# Patient Record
Sex: Male | Born: 1970 | Race: White | Hispanic: No | Marital: Single | Smoking: Current every day smoker
Health system: Southern US, Community
[De-identification: ages and names within clinical notes are randomized; demographics above are authoritative.]

## PROBLEM LIST (undated history)

## (undated) DIAGNOSIS — S3992XA Unspecified injury of lower back, initial encounter: Secondary | ICD-10-CM

## (undated) DIAGNOSIS — M549 Dorsalgia, unspecified: Secondary | ICD-10-CM

## (undated) DIAGNOSIS — C6290 Malignant neoplasm of unspecified testis, unspecified whether descended or undescended: Secondary | ICD-10-CM

## (undated) DIAGNOSIS — G8929 Other chronic pain: Secondary | ICD-10-CM

## (undated) DIAGNOSIS — M1990 Unspecified osteoarthritis, unspecified site: Secondary | ICD-10-CM

## (undated) HISTORY — PX: TESTICLE REMOVAL: SHX68

---

## 2003-10-16 ENCOUNTER — Emergency Department (HOSPITAL_COMMUNITY): Admission: EM | Admit: 2003-10-16 | Discharge: 2003-10-16 | Payer: Self-pay | Admitting: Emergency Medicine

## 2003-12-25 ENCOUNTER — Emergency Department (HOSPITAL_COMMUNITY): Admission: EM | Admit: 2003-12-25 | Discharge: 2003-12-25 | Payer: Self-pay | Admitting: Emergency Medicine

## 2004-06-09 ENCOUNTER — Emergency Department (HOSPITAL_COMMUNITY): Admission: EM | Admit: 2004-06-09 | Discharge: 2004-06-09 | Payer: Self-pay | Admitting: *Deleted

## 2004-07-23 ENCOUNTER — Ambulatory Visit: Payer: Self-pay | Admitting: Psychiatry

## 2004-07-23 ENCOUNTER — Emergency Department (HOSPITAL_COMMUNITY): Admission: EM | Admit: 2004-07-23 | Discharge: 2004-07-23 | Payer: Self-pay | Admitting: Emergency Medicine

## 2004-07-23 ENCOUNTER — Inpatient Hospital Stay (HOSPITAL_COMMUNITY): Admission: RE | Admit: 2004-07-23 | Discharge: 2004-07-27 | Payer: Self-pay | Admitting: Psychiatry

## 2004-12-08 ENCOUNTER — Emergency Department (HOSPITAL_COMMUNITY): Admission: EM | Admit: 2004-12-08 | Discharge: 2004-12-08 | Payer: Self-pay | Admitting: *Deleted

## 2005-07-18 ENCOUNTER — Emergency Department (HOSPITAL_COMMUNITY): Admission: EM | Admit: 2005-07-18 | Discharge: 2005-07-19 | Payer: Self-pay | Admitting: *Deleted

## 2005-07-24 ENCOUNTER — Emergency Department (HOSPITAL_COMMUNITY): Admission: EM | Admit: 2005-07-24 | Discharge: 2005-07-24 | Payer: Self-pay | Admitting: Emergency Medicine

## 2006-05-22 ENCOUNTER — Emergency Department (HOSPITAL_COMMUNITY): Admission: EM | Admit: 2006-05-22 | Discharge: 2006-05-23 | Payer: Self-pay | Admitting: Emergency Medicine

## 2006-06-24 ENCOUNTER — Emergency Department (HOSPITAL_COMMUNITY): Admission: EM | Admit: 2006-06-24 | Discharge: 2006-06-24 | Payer: Self-pay | Admitting: Emergency Medicine

## 2007-02-26 ENCOUNTER — Emergency Department: Payer: Self-pay | Admitting: Emergency Medicine

## 2007-03-30 ENCOUNTER — Emergency Department (HOSPITAL_COMMUNITY): Admission: EM | Admit: 2007-03-30 | Discharge: 2007-03-31 | Payer: Self-pay | Admitting: Emergency Medicine

## 2007-06-03 ENCOUNTER — Emergency Department (HOSPITAL_COMMUNITY): Admission: EM | Admit: 2007-06-03 | Discharge: 2007-06-03 | Payer: Self-pay | Admitting: Emergency Medicine

## 2007-07-20 ENCOUNTER — Emergency Department: Payer: Self-pay | Admitting: Emergency Medicine

## 2007-11-14 ENCOUNTER — Emergency Department: Payer: Self-pay | Admitting: Emergency Medicine

## 2007-11-28 ENCOUNTER — Ambulatory Visit: Payer: Self-pay | Admitting: Urology

## 2007-12-09 ENCOUNTER — Ambulatory Visit: Payer: Self-pay | Admitting: Radiation Oncology

## 2007-12-27 ENCOUNTER — Ambulatory Visit: Payer: Self-pay | Admitting: Urology

## 2007-12-29 ENCOUNTER — Ambulatory Visit: Payer: Self-pay | Admitting: Radiation Oncology

## 2008-01-09 ENCOUNTER — Ambulatory Visit: Payer: Self-pay | Admitting: Radiation Oncology

## 2008-02-08 ENCOUNTER — Ambulatory Visit: Payer: Self-pay | Admitting: Radiation Oncology

## 2008-03-10 ENCOUNTER — Ambulatory Visit: Payer: Self-pay | Admitting: Radiation Oncology

## 2008-07-21 ENCOUNTER — Emergency Department (HOSPITAL_BASED_OUTPATIENT_CLINIC_OR_DEPARTMENT_OTHER): Admission: EM | Admit: 2008-07-21 | Discharge: 2008-07-21 | Payer: Self-pay | Admitting: Emergency Medicine

## 2009-01-21 ENCOUNTER — Ambulatory Visit: Payer: Self-pay | Admitting: Family Medicine

## 2009-01-21 ENCOUNTER — Encounter: Payer: Self-pay | Admitting: Family Medicine

## 2009-01-21 DIAGNOSIS — R5383 Other fatigue: Secondary | ICD-10-CM

## 2009-01-21 DIAGNOSIS — R5381 Other malaise: Secondary | ICD-10-CM

## 2009-01-21 DIAGNOSIS — R42 Dizziness and giddiness: Secondary | ICD-10-CM

## 2009-01-21 DIAGNOSIS — R0789 Other chest pain: Secondary | ICD-10-CM | POA: Insufficient documentation

## 2009-01-21 DIAGNOSIS — C6290 Malignant neoplasm of unspecified testis, unspecified whether descended or undescended: Secondary | ICD-10-CM

## 2009-01-29 ENCOUNTER — Ambulatory Visit: Payer: Self-pay | Admitting: Oncology

## 2009-04-07 ENCOUNTER — Emergency Department (HOSPITAL_COMMUNITY): Admission: EM | Admit: 2009-04-07 | Discharge: 2009-04-07 | Payer: Self-pay | Admitting: Emergency Medicine

## 2009-04-09 ENCOUNTER — Ambulatory Visit: Payer: Self-pay | Admitting: Family Medicine

## 2009-04-23 ENCOUNTER — Telehealth: Payer: Self-pay | Admitting: Family Medicine

## 2009-04-24 ENCOUNTER — Ambulatory Visit: Payer: Self-pay | Admitting: Family Medicine

## 2009-06-02 ENCOUNTER — Emergency Department (HOSPITAL_COMMUNITY): Admission: EM | Admit: 2009-06-02 | Discharge: 2009-06-02 | Payer: Self-pay | Admitting: Emergency Medicine

## 2009-12-22 ENCOUNTER — Emergency Department (HOSPITAL_COMMUNITY): Admission: EM | Admit: 2009-12-22 | Discharge: 2009-12-22 | Payer: Self-pay | Admitting: Emergency Medicine

## 2010-03-19 ENCOUNTER — Emergency Department (HOSPITAL_COMMUNITY): Admission: EM | Admit: 2010-03-19 | Discharge: 2010-03-19 | Payer: Self-pay | Admitting: Emergency Medicine

## 2010-07-31 ENCOUNTER — Emergency Department (HOSPITAL_BASED_OUTPATIENT_CLINIC_OR_DEPARTMENT_OTHER)
Admission: EM | Admit: 2010-07-31 | Discharge: 2010-07-31 | Payer: Self-pay | Source: Home / Self Care | Admitting: Emergency Medicine

## 2010-08-06 ENCOUNTER — Emergency Department (HOSPITAL_BASED_OUTPATIENT_CLINIC_OR_DEPARTMENT_OTHER)
Admission: EM | Admit: 2010-08-06 | Discharge: 2010-08-06 | Payer: Self-pay | Source: Home / Self Care | Admitting: Emergency Medicine

## 2010-09-07 LAB — CONVERTED CEMR LAB
ALT: 20 units/L (ref 0–53)
AST: 19 units/L (ref 0–37)
Albumin: 4.3 g/dL (ref 3.5–5.2)
Alkaline Phosphatase: 70 units/L (ref 39–117)
BUN: 19 mg/dL (ref 6–23)
Basophils Absolute: 0 10*3/uL (ref 0.0–0.1)
Basophils Relative: 0.1 % (ref 0.0–3.0)
Bilirubin, Direct: 0.2 mg/dL (ref 0.0–0.3)
CO2: 27 meq/L (ref 19–32)
Calcium: 9.3 mg/dL (ref 8.4–10.5)
Chloride: 98 meq/L (ref 96–112)
Cholesterol: 180 mg/dL (ref 0–200)
Creatinine, Ser: 0.8 mg/dL (ref 0.4–1.5)
Eosinophils Absolute: 0.1 10*3/uL (ref 0.0–0.7)
Eosinophils Relative: 1.4 % (ref 0.0–5.0)
GFR calc non Af Amer: 114.44 mL/min (ref >60)
Glucose, Bld: 80 mg/dL (ref 70–99)
HCT: 47.4 % (ref 39.0–52.0)
HDL: 50.9 mg/dL (ref >39.00)
Hemoglobin: 16.8 g/dL (ref 13.0–17.0)
LDL Cholesterol: 113 mg/dL — ABNORMAL HIGH (ref 0–99)
Lymphocytes Relative: 12.1 % (ref 12.0–46.0)
Lymphs Abs: 0.7 10*3/uL (ref 0.7–4.0)
MCHC: 35.5 g/dL (ref 30.0–36.0)
MCV: 93.2 fL (ref 78.0–100.0)
Monocytes Absolute: 0.4 10*3/uL (ref 0.1–1.0)
Monocytes Relative: 6.3 % (ref 3.0–12.0)
Neutro Abs: 4.8 10*3/uL (ref 1.4–7.7)
Neutrophils Relative %: 80.1 % — ABNORMAL HIGH (ref 43.0–77.0)
Platelets: 171 10*3/uL (ref 150.0–400.0)
Potassium: 4 meq/L (ref 3.5–5.1)
RBC: 5.09 M/uL (ref 4.22–5.81)
RDW: 13.3 % (ref 11.5–14.6)
Sodium: 131 meq/L — ABNORMAL LOW (ref 135–145)
TSH: 1.82 microintl units/mL (ref 0.35–5.50)
Total Bilirubin: 0.9 mg/dL (ref 0.3–1.2)
Total CHOL/HDL Ratio: 4
Total Protein: 7.1 g/dL (ref 6.0–8.3)
Triglycerides: 82 mg/dL (ref 0.0–149.0)
VLDL: 16.4 mg/dL (ref 0.0–40.0)
WBC: 6 10*3/uL (ref 4.5–10.5)

## 2010-09-20 ENCOUNTER — Emergency Department (HOSPITAL_BASED_OUTPATIENT_CLINIC_OR_DEPARTMENT_OTHER)
Admission: EM | Admit: 2010-09-20 | Discharge: 2010-09-20 | Disposition: A | Discharge status: Home / Self Care | Payer: Worker's Compensation | Attending: Emergency Medicine | Admitting: Emergency Medicine

## 2010-09-20 ENCOUNTER — Emergency Department (INDEPENDENT_AMBULATORY_CARE_PROVIDER_SITE_OTHER): Payer: Worker's Compensation

## 2010-09-20 DIAGNOSIS — F17200 Nicotine dependence, unspecified, uncomplicated: Secondary | ICD-10-CM | POA: Insufficient documentation

## 2010-09-20 DIAGNOSIS — Y9269 Other specified industrial and construction area as the place of occurrence of the external cause: Secondary | ICD-10-CM

## 2010-09-20 DIAGNOSIS — F319 Bipolar disorder, unspecified: Secondary | ICD-10-CM | POA: Insufficient documentation

## 2010-09-20 DIAGNOSIS — S20229A Contusion of unspecified back wall of thorax, initial encounter: Secondary | ICD-10-CM | POA: Insufficient documentation

## 2010-09-20 DIAGNOSIS — W19XXXA Unspecified fall, initial encounter: Secondary | ICD-10-CM | POA: Insufficient documentation

## 2010-09-20 DIAGNOSIS — Y9289 Other specified places as the place of occurrence of the external cause: Secondary | ICD-10-CM | POA: Insufficient documentation

## 2010-09-20 DIAGNOSIS — K219 Gastro-esophageal reflux disease without esophagitis: Secondary | ICD-10-CM | POA: Insufficient documentation

## 2010-10-17 ENCOUNTER — Emergency Department (HOSPITAL_COMMUNITY)
Admission: EM | Admit: 2010-10-17 | Discharge: 2010-10-18 | Disposition: A | Discharge status: Other Acute Inpatient Hospital | Payer: BC Managed Care – PPO | Attending: Emergency Medicine | Admitting: Emergency Medicine

## 2010-10-17 DIAGNOSIS — F3289 Other specified depressive episodes: Secondary | ICD-10-CM | POA: Insufficient documentation

## 2010-10-17 DIAGNOSIS — F329 Major depressive disorder, single episode, unspecified: Secondary | ICD-10-CM | POA: Insufficient documentation

## 2010-10-17 DIAGNOSIS — F489 Nonpsychotic mental disorder, unspecified: Secondary | ICD-10-CM | POA: Insufficient documentation

## 2010-10-17 LAB — DIFFERENTIAL
Basophils Absolute: 0 10*3/uL (ref 0.0–0.1)
Basophils Relative: 0 % (ref 0–1)
Eosinophils Absolute: 0.2 10*3/uL (ref 0.0–0.7)
Eosinophils Relative: 3 % (ref 0–5)
Lymphocytes Relative: 26 % (ref 12–46)
Lymphs Abs: 1.8 10*3/uL (ref 0.7–4.0)
Monocytes Absolute: 0.5 10*3/uL (ref 0.1–1.0)
Monocytes Relative: 7 % (ref 3–12)
Neutro Abs: 4.4 10*3/uL (ref 1.7–7.7)
Neutrophils Relative %: 64 % (ref 43–77)

## 2010-10-17 LAB — COMPREHENSIVE METABOLIC PANEL
ALT: 22 U/L (ref 0–53)
AST: 20 U/L (ref 0–37)
Albumin: 4.4 g/dL (ref 3.5–5.2)
Alkaline Phosphatase: 68 U/L (ref 39–117)
BUN: 10 mg/dL (ref 6–23)
CO2: 28 mEq/L (ref 19–32)
Calcium: 9.6 mg/dL (ref 8.4–10.5)
Chloride: 104 mEq/L (ref 96–112)
Creatinine, Ser: 1.02 mg/dL (ref 0.4–1.5)
GFR calc Af Amer: >60 mL/min (ref >60)
GFR calc non Af Amer: >60 mL/min (ref >60)
Glucose, Bld: 102 mg/dL — ABNORMAL HIGH (ref 70–99)
Potassium: 3.9 mEq/L (ref 3.5–5.1)
Sodium: 138 mEq/L (ref 135–145)
Total Bilirubin: 0.4 mg/dL (ref 0.3–1.2)
Total Protein: 7.1 g/dL (ref 6.0–8.3)

## 2010-10-17 LAB — ETHANOL: Alcohol, Ethyl (B): <5 mg/dL (ref 0–10)

## 2010-10-17 LAB — RAPID URINE DRUG SCREEN, HOSP PERFORMED
Amphetamines: NOT DETECTED
Barbiturates: NOT DETECTED
Benzodiazepines: NOT DETECTED
Cocaine: NOT DETECTED
Opiates: NOT DETECTED
Tetrahydrocannabinol: NOT DETECTED

## 2010-10-17 LAB — CBC
HCT: 46.6 % (ref 39.0–52.0)
Hemoglobin: 16.1 g/dL (ref 13.0–17.0)
MCH: 32.2 pg (ref 26.0–34.0)
MCHC: 34.5 g/dL (ref 30.0–36.0)
MCV: 93.2 fL (ref 78.0–100.0)
Platelets: 234 10*3/uL (ref 150–400)
RBC: 5 MIL/uL (ref 4.22–5.81)
RDW: 12.6 % (ref 11.5–15.5)
WBC: 6.9 10*3/uL (ref 4.0–10.5)

## 2010-10-18 ENCOUNTER — Inpatient Hospital Stay (HOSPITAL_COMMUNITY)
Admission: RE | Admit: 2010-10-18 | Discharge: 2010-10-21 | DRG: 430 | Disposition: A | Discharge status: Home / Self Care | Payer: BC Managed Care – PPO | Source: Ambulatory Visit | Attending: Psychiatry | Admitting: Psychiatry

## 2010-10-18 DIAGNOSIS — R45851 Suicidal ideations: Secondary | ICD-10-CM

## 2010-10-18 DIAGNOSIS — F411 Generalized anxiety disorder: Secondary | ICD-10-CM

## 2010-10-18 DIAGNOSIS — F1220 Cannabis dependence, uncomplicated: Secondary | ICD-10-CM

## 2010-10-18 DIAGNOSIS — F319 Bipolar disorder, unspecified: Secondary | ICD-10-CM

## 2010-10-18 DIAGNOSIS — Z560 Unemployment, unspecified: Secondary | ICD-10-CM

## 2010-10-18 DIAGNOSIS — F332 Major depressive disorder, recurrent severe without psychotic features: Principal | ICD-10-CM

## 2010-10-18 DIAGNOSIS — F331 Major depressive disorder, recurrent, moderate: Secondary | ICD-10-CM

## 2010-10-19 DIAGNOSIS — F39 Unspecified mood [affective] disorder: Secondary | ICD-10-CM

## 2010-10-19 DIAGNOSIS — F1220 Cannabis dependence, uncomplicated: Secondary | ICD-10-CM

## 2010-10-20 LAB — URINALYSIS, ROUTINE W REFLEX MICROSCOPIC
Bilirubin Urine: NEGATIVE
Glucose, UA: NEGATIVE mg/dL
Hgb urine dipstick: NEGATIVE
Ketones, ur: NEGATIVE mg/dL
Nitrite: NEGATIVE
Protein, ur: NEGATIVE mg/dL
Specific Gravity, Urine: 1.045 — ABNORMAL HIGH (ref 1.005–1.030)
Urobilinogen, UA: 0.2 mg/dL (ref 0.0–1.0)
pH: 5 (ref 5.0–8.0)

## 2010-10-20 LAB — DIFFERENTIAL
Basophils Absolute: 0 10*3/uL (ref 0.0–0.1)
Basophils Relative: 0 % (ref 0–1)
Eosinophils Absolute: 0.3 10*3/uL (ref 0.0–0.7)
Eosinophils Relative: 4 % (ref 0–5)
Lymphocytes Relative: 20 % (ref 12–46)
Lymphs Abs: 1.4 10*3/uL (ref 0.7–4.0)
Monocytes Absolute: 0.7 10*3/uL (ref 0.1–1.0)
Monocytes Relative: 10 % (ref 3–12)
Neutro Abs: 4.6 10*3/uL (ref 1.7–7.7)
Neutrophils Relative %: 66 % (ref 43–77)

## 2010-10-20 LAB — BASIC METABOLIC PANEL
BUN: 18 mg/dL (ref 6–23)
CO2: 21 mEq/L (ref 19–32)
Calcium: 9.2 mg/dL (ref 8.4–10.5)
Chloride: 112 mEq/L (ref 96–112)
Creatinine, Ser: 0.9 mg/dL (ref 0.4–1.5)
GFR calc Af Amer: >60 mL/min (ref >60)
GFR calc non Af Amer: >60 mL/min (ref >60)
Glucose, Bld: 96 mg/dL (ref 70–99)
Potassium: 4 mEq/L (ref 3.5–5.1)
Sodium: 144 mEq/L (ref 135–145)

## 2010-10-20 LAB — CBC
HCT: 43.2 % (ref 39.0–52.0)
Hemoglobin: 15 g/dL (ref 13.0–17.0)
MCH: 31.5 pg (ref 26.0–34.0)
MCHC: 34.7 g/dL (ref 30.0–36.0)
MCV: 90.8 fL (ref 78.0–100.0)
Platelets: 262 10*3/uL (ref 150–400)
RBC: 4.76 MIL/uL (ref 4.22–5.81)
RDW: 13 % (ref 11.5–15.5)
WBC: 7.1 10*3/uL (ref 4.0–10.5)

## 2010-10-23 NOTE — Discharge Summary (Signed)
NAME:  Eugene Lin, Eugene Lin              ACCOUNT NO.:  [id number]  MEDICAL RECORD NO.:  [id number]           PATIENT TYPE:  I  LOCATION:  0504                          FACILITY:  [facility]  PHYSICIAN:  [name], DO        DATE OF BIRTH:  [birth year]  DATE OF ADMISSION:  10/18/2010 DATE OF DISCHARGE:  10/20/2010                              DISCHARGE SUMMARY   REASON FOR ADMISSION:  This is a 40 year old male that was admitted after patient attempted to hang himself after arguing with his mother. Girlfriend brought the patient to the emergency department.  No history of any alcohol or substance use.  He initially had some homicidal ideation towards a man that he blamed for losing his employment.  FINAL DIAGNOSES:  Axis I:  Major depressive disorder, recurrent, severe, without psychotic features; synthetic marijuana dependence; rule out bipolar disorder, not otherwise specified. Axis II:  Deferred. Axis III:  None. Axis IV:  Financial, relationship stressors, and occupation. Axis V:  Current is 50 to 55.  SIGNIFICANT LABS:  BMET is within normal limits.  Alcohol level was less than 5.  Urine drug screen was negative.  CBC is within normal limits.  SIGNIFICANT FINDINGS:  This is a fully alert and oriented male, appropriately groomed and dressed.  Speech:  Normal rate, rhythm and tone.  Mood is somewhat irritable.  Thought processes were clear, rational and goal-oriented.  The patient was admitted to the adult unit into the mood disorder group.  He had been on initiated on Risperdal to stabilize mood and help with depression, anxiety.  The patient was endorsing racing thoughts, poor sleep and mood swings.  We contacted the patient's fiancee to address safety concerns and to provide information. The patient reported, again, his symptoms of depression and mood changes, episodes of rage.  We initiated Depakote for mood stability. On day of discharge, the patient was feeling much better.  He  felt he was in a good place.  He tolerated his first dose of Depakote.  His suicidal thoughts had resolved.  He had no homicidal thoughts.  He had good news about a job interview.  He was future-directed and was going to be living with his fiancee.  He showed no signs of hypomania, mania or overt anxiety.  We felt the patient was stable for discharge.  DISCHARGE MEDICATIONS: 1. Depakote 500 mg 1 q.h.s. 2. Risperdal 0.5 mg 1 tablet b.i.d. 3. Vistaril 2 capsules q.h.s. sleep.  FURTHER INSTRUCTIONS:  The patient was to get lab work; CBC, LFTs and valproic acid level in 5 days and was to follow up with Dr. Sandria Manly for a prescription adjustment.  The patient had appointment on March 22 at 1:45; also had an appointment with [facility] on Wednesday, March 21. The patient was to go to the emergency room for any suicidal, homicidal thoughts or psychotic symptoms.     Landry Corporal, N.P.   ______________________________ [name], DO    JO/MEDQ  D:  10/22/2010  T:  10/22/2010  Job:  [id number]  Electronically Signed by Limmie PatriciaP. on 10/23/2010 09:17:25 AM Electronically Signed by  [name] MD on 10/23/2010 09:21:03 PM

## 2010-10-24 LAB — CBC
HCT: 42.7 % (ref 39.0–52.0)
MCH: 31.8 pg (ref 26.0–34.0)
MCHC: 33.7 g/dL (ref 30.0–36.0)
MCV: 94.3 fL (ref 78.0–100.0)
RDW: 13.3 % (ref 11.5–15.5)

## 2010-10-24 LAB — BASIC METABOLIC PANEL
BUN: 12 mg/dL (ref 6–23)
CO2: 25 mEq/L (ref 19–32)
Calcium: 8.7 mg/dL (ref 8.4–10.5)
Chloride: 108 mEq/L (ref 96–112)
Creatinine, Ser: 0.95 mg/dL (ref 0.4–1.5)
GFR calc Af Amer: >60 mL/min (ref >60)
GFR calc non Af Amer: >60 mL/min (ref >60)
Glucose, Bld: 105 mg/dL — ABNORMAL HIGH (ref 70–99)
Potassium: 4.5 mEq/L (ref 3.5–5.1)
Sodium: 138 mEq/L (ref 135–145)

## 2010-10-24 LAB — DIFFERENTIAL
Basophils Absolute: 0 10*3/uL (ref 0.0–0.1)
Basophils Relative: 0 % (ref 0–1)
Eosinophils Absolute: 0.2 10*3/uL (ref 0.0–0.7)
Eosinophils Relative: 4 % (ref 0–5)
Monocytes Absolute: 0.5 10*3/uL (ref 0.1–1.0)
Monocytes Relative: 10 % (ref 3–12)

## 2010-10-26 NOTE — Discharge Summary (Signed)
NAME:  Eugene Lin, Eugene Lin NO.:  [id number]  MEDICAL RECORD NO.:  [id number]           PATIENT TYPE:  I  LOCATION:  0504                          FACILITY:  [facility]  PHYSICIAN:  [name], DO        DATE OF BIRTH:  May 23, 1971  DATE OF ADMISSION:  10/18/2010 DATE OF DISCHARGE:                              DISCHARGE SUMMARY   This is a voluntary admission to the services of Dr. [name].  This is a 40 year old divorced white male.  Apparently on Thursday March 8th, he got into an altercation with his mother.  He went to the garage. He attempted to hang himself.  His girl friend brought him to the ED at [facility] Friday evening, where he was presented for admission. Specifically, his UDS was negative.  He had no alcohol on board.  He had no abnormalities for CBC.  He states that he smokes something called synthetic marijuana, that it is addictive to him, and that he has not had any since March 7th.  At the time of presentation, he was still endorsing suicidal ideation.  He wanted to blame the man that he blames for losing his employment back in January.  He says he had a slip and fall, and they basically gave him $4000 to lose his job.  He was in [facility] back in September or October, and he preferred to come to the [facility] and also point out that he does well on Xanax.  PAST PSYCHIATRIC HISTORY:  He has been with [place] once before in the past. He states that his most recent hospitalization was in September to October at [facility].  He sees Dr. Sandria Manly in [facility] for outpatient psychiatric care.  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  He has been married and divorced twice.  He is currently engaged.  He has 1 daughter, age 73.  He was last employed approximately a month or so ago. He was at [employer] as a Chartered certified accountant.  He had a slip-and-fall incident, and he states he was paid off to leave.  FAMILY HISTORY:  He  states his mother drinks.  ALCOHOL/DRUG HISTORY:  He himself uses synthetic marijuana for the past 30 years, 5-6 bowls a day.  He states his last use was on March 7.  MEDICATIONS:  He has no medications since December.  DRUG ALLERGIES:  No known drug allergies.  PERTINENT PHYSICAL FINDINGS:  Have already been commented on. VITAL SIGNS:  His vital signs showed he was at 97.3 to 98.  His blood pressure was 104/65 to 133/87.  Pulse was 76-93.  Respirations were 16- 20.  MENTAL STATUS EXAM:  Today he is alert and oriented.  He is appropriately groomed and dressed, albeit in hospital scrubs.  His speech is of normal rate, rhythm and tone.  His mood is somewhat irritable, as he claims that he is in "withdrawal" from synthetic marijuana.  Thought processes are clear, rational, and goal-oriented. Judgment and insight are intact.  Concentration and memory are intact. Intelligence is average.  DIAGNOSIS:  AXIS I: 1. Major depressive disorder, recurrent, severe without psychotic     features. 2. Synthetic marijuana dependence.AXIS II:  Deferred.  AXIS III:  None known.  AXIS IV:  Financial, relationship stressors.  AXIS V: 1.  He was seen already by Dr. Rogers Blocker, who felt that his Axis I diagnosis is mood disorder NOS, rule out bipolar.  He started him on Risperdal 0.5 mg p.o. b.i.d. to stabilize his mood and to help with his depression and anxiety.  Estimated length of stay is 5-7 days.     [name], [name]   ______________________________ [name], DO    MD/MEDQ  D:  10/19/2010  T:  10/19/2010  Job:  [id number]  Electronically Signed by [name] on 10/26/2010 12:49:09 PM Electronically Signed by [name] MD on 10/26/2010 10:12:31 PM

## 2010-10-27 LAB — COMPREHENSIVE METABOLIC PANEL
ALT: 19 U/L (ref 0–53)
Albumin: 3.9 g/dL (ref 3.5–5.2)
Alkaline Phosphatase: 60 U/L (ref 39–117)
Calcium: 9.3 mg/dL (ref 8.4–10.5)
Potassium: 3.7 mEq/L (ref 3.5–5.1)
Sodium: 138 mEq/L (ref 135–145)
Total Protein: 6.2 g/dL (ref 6.0–8.3)

## 2010-10-27 LAB — DIFFERENTIAL
Basophils Relative: 1 % (ref 0–1)
Eosinophils Absolute: 0.1 10*3/uL (ref 0.0–0.7)
Lymphs Abs: 1 10*3/uL (ref 0.7–4.0)
Monocytes Absolute: 0.5 10*3/uL (ref 0.1–1.0)
Monocytes Relative: 7 % (ref 3–12)
Neutro Abs: 4.9 10*3/uL (ref 1.7–7.7)
Neutrophils Relative %: 75 % (ref 43–77)

## 2010-10-27 LAB — URINALYSIS, ROUTINE W REFLEX MICROSCOPIC
Bilirubin Urine: NEGATIVE
Glucose, UA: NEGATIVE mg/dL
Hgb urine dipstick: NEGATIVE
Specific Gravity, Urine: 1.025 (ref 1.005–1.030)
Urobilinogen, UA: 0.2 mg/dL (ref 0.0–1.0)

## 2010-10-27 LAB — CBC
MCHC: 35.8 g/dL (ref 30.0–36.0)
Platelets: 206 10*3/uL (ref 150–400)
RDW: 13 % (ref 11.5–15.5)

## 2010-12-26 NOTE — Discharge Summary (Signed)
NAME:  Eugene Lin, Eugene Lin              ACCOUNT NO.:  [id number]   MEDICAL RECORD NO.:  [id number]          PATIENT TYPE:  IPS   LOCATION:  0303                          FACILITY:  [facility]   PHYSICIAN:  Syed T. Arfeen, M.D.   DATE OF BIRTH:  [birth year]   DATE OF ADMISSION:  07/23/2004  DATE OF DISCHARGE:  07/27/2004                                 DISCHARGE SUMMARY   IDENTIFYING INFORMATION:  The patient is a 40 year old, divorced, white man  who came as a voluntary admission.   HISTORY OF PRESENT ILLNESS:  He called the police, became agitated, I  wanted to end my life.  He said he recently broke up with a girlfriend two  weeks ago.  Very emotional and tearful.  Patient has a history of  noncompliance with his medications.  He said he was seeing a psychiatrist in  [place].  However, he ran out of his medications.  He recently moved to  [facility] and feels that he has been decompensating.  His urine toxicology  was positive for marijuana and alcohol.  He reported suicidal thoughts by  sticking a knife into his throat.  For details, please see admission note.   PAST PSYCHIATRIC HISTORY:  This is the first admission to [facility].  However, this is the second time he has been hospitalized in the  psychiatric unit.  [place] when  he had a suicidal attempt by attempting to hang himself.  Patient was  treated with Seroquel, Remeron and trazodone in the past.  However, he has  been noncompliant since February, as he ran out and moved to [place].   PAST MEDICAL HISTORY:  He denies any medical problems.  No primary care  physician.   CURRENT MEDICATIONS:  Noncompliant with his psychotropic medications.   DRUG ALLERGIES:  No known drug allergies.   PHYSICAL FINDINGS:  Physical was done in ER; essentially within normal  limits.   MENTAL STATUS EXAM:  Fairly alert, cooperative, but tearful and emotional.  Affect constricted.   Somewhat isolated and guarded.  Speech low tone.  Mood  dysphoric.  Thought processes logical, goal-directed.  Passive suicidal  thoughts.  Alert and oriented times three.  Insight and judgment partial.   ADMITTING DIAGNOSIS:   AXIS I:  1.  Bipolar disorder.  2.  Cocaine abuse.  3.  Alcohol abuse.   AXIS II:  Deferred.   AXIS III:  Recent breakup with a girlfriend.   AXIS IV:  Severe and moderate.   AXIS V:  30.   HOSPITAL COURSE:  The patient was admitted and ordered p.r.n. and routine  medication.  He was placed on Seroquel 300 mg three times a day and he was  encouraged to participate in individual, group and milieu therapies.  He was  also monitored for substance abuse withdrawal, detox and overdose treatment.  He was educated regarding substance abuse and abstinence as well as  healthier coping skills.  He was given trazodone 100 mg at bedtime for  sleep.  The next morning the patient responded very  well with the  medication.  He said that he has been doing very well with the medication,  though he was still worried about his future, but overall he started to feel  better.  He was encouraged to participate in individual, group and milieu  therapies.  The next morning, the patient was seen verbal, active, social in  the groups.  He stated that he wanted to be discharged because he has to go  back to his work.  No shakes, tremors were noted.  Discharge planning  discussed.  He stated that he would like to be followed up at the outpatient  clinic.  He denies any suicidal thoughts, homicidal thoughts or any  hallucinations.  He reported his mood has been improved with increased  coping skills.  He reported a positive response to clinical intervention and  medication regime.   CONDITION ON DISCHARGE:  Remarkably improved; mood euthymic; affect bright;  thought processes goal-directed; thought content negative for dangerous  ideation or any psychosis.   FOLLOW UP:  He was  asked to call on Monday to set up a follow-up  appointment, which he agreed.   DISCHARGE MEDICATIONS:  He was discharged on:  1.  Trazodone 100 mg q.h.s.  2.  Seroquel 300 mg three times a day.   DISCHARGE DIAGNOSES:   AXIS I:  Bipolar disorder.   AXIS II:  Deferred.   AXIS III:  None.   AXIS IV:  Moderate.   AXIS V:  70Kathi Ludwig   STA/MEDQ  D:  07/30/2004  T:  07/31/2004  Job:  [id number]

## 2013-10-17 ENCOUNTER — Emergency Department (HOSPITAL_BASED_OUTPATIENT_CLINIC_OR_DEPARTMENT_OTHER): Payer: BC Managed Care – PPO

## 2013-10-17 ENCOUNTER — Emergency Department (HOSPITAL_BASED_OUTPATIENT_CLINIC_OR_DEPARTMENT_OTHER)
Admission: EM | Admit: 2013-10-17 | Discharge: 2013-10-17 | Disposition: A | Discharge status: Home / Self Care | Payer: BC Managed Care – PPO | Attending: Emergency Medicine | Admitting: Emergency Medicine

## 2013-10-17 ENCOUNTER — Encounter (HOSPITAL_BASED_OUTPATIENT_CLINIC_OR_DEPARTMENT_OTHER): Payer: Self-pay | Admitting: Emergency Medicine

## 2013-10-17 DIAGNOSIS — L290 Pruritus ani: Secondary | ICD-10-CM | POA: Insufficient documentation

## 2013-10-17 DIAGNOSIS — R059 Cough, unspecified: Secondary | ICD-10-CM | POA: Insufficient documentation

## 2013-10-17 DIAGNOSIS — R748 Abnormal levels of other serum enzymes: Secondary | ICD-10-CM | POA: Insufficient documentation

## 2013-10-17 DIAGNOSIS — F17200 Nicotine dependence, unspecified, uncomplicated: Secondary | ICD-10-CM | POA: Insufficient documentation

## 2013-10-17 DIAGNOSIS — R1110 Vomiting, unspecified: Secondary | ICD-10-CM | POA: Insufficient documentation

## 2013-10-17 DIAGNOSIS — D1779 Benign lipomatous neoplasm of other sites: Secondary | ICD-10-CM | POA: Insufficient documentation

## 2013-10-17 DIAGNOSIS — R05 Cough: Secondary | ICD-10-CM | POA: Insufficient documentation

## 2013-10-17 DIAGNOSIS — R197 Diarrhea, unspecified: Secondary | ICD-10-CM | POA: Insufficient documentation

## 2013-10-17 DIAGNOSIS — R1031 Right lower quadrant pain: Secondary | ICD-10-CM | POA: Insufficient documentation

## 2013-10-17 DIAGNOSIS — R1013 Epigastric pain: Secondary | ICD-10-CM | POA: Insufficient documentation

## 2013-10-17 HISTORY — DX: Malignant neoplasm of unspecified testis, unspecified whether descended or undescended: C62.90

## 2013-10-17 LAB — COMPREHENSIVE METABOLIC PANEL
ALBUMIN: 4.6 g/dL (ref 3.5–5.2)
ALK PHOS: 72 U/L (ref 39–117)
ALT: 20 U/L (ref 0–53)
AST: 24 U/L (ref 0–37)
BUN: 15 mg/dL (ref 6–23)
CO2: 23 mEq/L (ref 19–32)
Calcium: 9.9 mg/dL (ref 8.4–10.5)
Chloride: 100 mEq/L (ref 96–112)
Creatinine, Ser: 1 mg/dL (ref 0.50–1.35)
GFR calc Af Amer: >90 mL/min (ref >90)
GFR calc non Af Amer: >90 mL/min (ref >90)
Glucose, Bld: 94 mg/dL (ref 70–99)
POTASSIUM: 3.9 meq/L (ref 3.7–5.3)
SODIUM: 138 meq/L (ref 137–147)
TOTAL PROTEIN: 7.6 g/dL (ref 6.0–8.3)
Total Bilirubin: 0.7 mg/dL (ref 0.3–1.2)

## 2013-10-17 LAB — CBC
HCT: 45.8 % (ref 39.0–52.0)
Hemoglobin: 16.5 g/dL (ref 13.0–17.0)
MCH: 33.2 pg (ref 26.0–34.0)
MCHC: 36 g/dL (ref 30.0–36.0)
MCV: 92.2 fL (ref 78.0–100.0)
Platelets: 221 10*3/uL (ref 150–400)
RBC: 4.97 MIL/uL (ref 4.22–5.81)
RDW: 12.6 % (ref 11.5–15.5)
WBC: 7.7 10*3/uL (ref 4.0–10.5)

## 2013-10-17 LAB — URINALYSIS, ROUTINE W REFLEX MICROSCOPIC
BILIRUBIN URINE: NEGATIVE
Glucose, UA: NEGATIVE mg/dL
HGB URINE DIPSTICK: NEGATIVE
Ketones, ur: 15 mg/dL — AB
Leukocytes, UA: NEGATIVE
NITRITE: NEGATIVE
PH: 5.5 (ref 5.0–8.0)
Protein, ur: NEGATIVE mg/dL
SPECIFIC GRAVITY, URINE: 1.029 (ref 1.005–1.030)
Urobilinogen, UA: 0.2 mg/dL (ref 0.0–1.0)

## 2013-10-17 LAB — LIPASE, BLOOD: Lipase: 81 U/L — ABNORMAL HIGH (ref 11–59)

## 2013-10-17 LAB — I-STAT CG4 LACTIC ACID, ED: Lactic Acid, Venous: 0.75 mmol/L (ref 0.5–2.2)

## 2013-10-17 MED ORDER — MORPHINE SULFATE 4 MG/ML IJ SOLN
4.0000 mg | Freq: Once | INTRAMUSCULAR | Status: AC
  Administered 2013-10-17: 4 mg via INTRAVENOUS
  Filled 2013-10-17: qty 1

## 2013-10-17 MED ORDER — IOHEXOL 300 MG/ML  SOLN
50.0000 mL | Freq: Once | INTRAMUSCULAR | Status: AC | PRN
  Administered 2013-10-17: 50 mL via ORAL

## 2013-10-17 MED ORDER — HYDROCODONE-ACETAMINOPHEN 5-325 MG PO TABS
1.0000 | ORAL_TABLET | Freq: Four times a day (QID) | ORAL | Status: DC | PRN
Start: 2013-10-17 — End: 2014-04-13

## 2013-10-17 MED ORDER — IOHEXOL 300 MG/ML  SOLN
100.0000 mL | Freq: Once | INTRAMUSCULAR | Status: AC | PRN
  Administered 2013-10-17: 100 mL via INTRAVENOUS

## 2013-10-17 MED ORDER — ONDANSETRON HCL 4 MG/2ML IJ SOLN
4.0000 mg | Freq: Once | INTRAMUSCULAR | Status: AC
  Administered 2013-10-17: 4 mg via INTRAVENOUS
  Filled 2013-10-17: qty 2

## 2013-10-17 MED ORDER — ALBENDAZOLE 200 MG PO TABS: 400.0000 mg | ORAL_TABLET | Freq: Once | ORAL | Status: DC

## 2013-10-17 MED ORDER — ONDANSETRON 4 MG PO TBDP: ORAL_TABLET | ORAL | Status: DC

## 2013-10-17 NOTE — ED Notes (Signed)
Lactic Acid results showed to Dr. Mingo Amber  0.75

## 2013-10-17 NOTE — ED Provider Notes (Signed)
CSN: [id number]     Arrival date & time 10/17/13  1600 History   First MD Initiated Contact with Patient 10/17/13 1658     Chief Complaint  Patient presents with  . Emesis     (Consider location/radiation/quality/duration/timing/severity/associated sxs/prior Treatment) Patient is a 43 y.o. male presenting with vomiting. The history is provided by the patient.  Emesis Severity:  Moderate Duration:  2 weeks Timing:  Intermittent Quality:  Stomach contents Able to tolerate:  Liquids Progression:  Worsening Chronicity:  New Recent urination:  Normal Context: not post-tussive   Relieved by:  Nothing Worsened by:  Nothing tried Associated symptoms: abdominal pain (epigastric, bilateral flanks), cough and diarrhea (for 2 weeks)   Associated symptoms: no chills, no fever and no URI     Past Medical History  Diagnosis Date  . Testicular cancer    Past Surgical History  Procedure Laterality Date  . Testicle removal     No family history on file. History  Substance Use Topics  . Smoking status: Current Every Day Smoker  . Smokeless tobacco: Not on file  . Alcohol Use: No    Review of Systems  Constitutional: Negative for chills.  Respiratory: Positive for cough. Negative for shortness of breath.   Cardiovascular: Negative for chest pain and leg swelling.  Gastrointestinal: Positive for vomiting, abdominal pain (epigastric, bilateral flanks) and diarrhea (for 2 weeks).  All other systems reviewed and are negative.      Allergies  Review of patient's allergies indicates no known allergies.  Home Medications  No current outpatient prescriptions on file. BP 137/80  Pulse 98  Temp(Src) 98.7 F (37.1 C) (Oral)  Resp 20  SpO2 99% Physical Exam  Nursing note and vitals reviewed. Constitutional: He is oriented to person, place, and time. He appears well-developed and well-nourished. No distress.  HENT:  Head: Normocephalic and atraumatic.  Mouth/Throat: No  oropharyngeal exudate.  Eyes: EOM are normal. Pupils are equal, round, and reactive to light.  Neck: Normal range of motion. Neck supple.  Cardiovascular: Normal rate and regular rhythm.  Exam reveals no friction rub.   No murmur heard. Pulmonary/Chest: Effort normal and breath sounds normal. No respiratory distress. He has no wheezes. He has no rales.  Abdominal: He exhibits no distension. There is tenderness (epigastric, RLQ). There is no rebound.  Genitourinary: Rectal exam shows tenderness (discomfort with exam). Rectal exam shows no external hemorrhoid, no internal hemorrhoid, no fissure, no mass and anal tone normal.  Musculoskeletal: Normal range of motion. He exhibits no edema.  Neurological: He is alert and oriented to person, place, and time.  Skin: No rash noted. He is not diaphoretic.    ED Course  Procedures (including critical care time) Labs Review Labs Reviewed  CBC  COMPREHENSIVE METABOLIC PANEL  LIPASE, BLOOD  URINALYSIS, ROUTINE W REFLEX MICROSCOPIC  I-STAT CG4 LACTIC ACID, ED   Imaging Review Dg Chest 2 View  10/17/2013   CLINICAL DATA Cough  EXAM CHEST  2 VIEW  COMPARISON July 31, 2010  FINDINGS The heart size and mediastinal contours are within normal limits. Both lungs are clear. The visualized skeletal structures are stable.  IMPRESSION No active cardiopulmonary disease.  SIGNATURE  Electronically Signed   By: Abelardo Diesel M.D.   On: 10/17/2013 19:20   Ct Abdomen Pelvis W Contrast  10/17/2013   CLINICAL DATA Nausea, vomiting, pain, history testicular cancer, smoking  EXAM CT ABDOMEN AND PELVIS WITH CONTRAST  TECHNIQUE Multidetector CT imaging of the abdomen and pelvis  was performed using the standard protocol following bolus administration of intravenous contrast. Sagittal and coronal MPR images reconstructed from axial data set.  CONTRAST 60mL OMNIPAQUE IOHEXOL 300 MG/ML SOLN ORALLY, 175mL OMNIPAQUE IOHEXOL 300 MG/ML SOLN IV  COMPARISON 05/25/2010  FINDINGS  Lung bases clear.  Liver, spleen, pancreas, kidneys, and adrenal glands normal appearance.  Tiny splenules at splenic hilum, stable.  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  Colon incompletely distended, unable to exclude mild scattered wall thickening particularly at the hepatic flexure region though this may represent artifact.  Stomach and bowel loops otherwise normal appearance.  Normal bladder in ureters.  No mass, adenopathy, free fluid or inflammatory process.  No hernia or acute bone lesions.  IMPRESSION Duodenal lipoma.  No definite acute intra-abdominal or intrapelvic abnormality.  SIGNATURE  Electronically Signed   By: Lavonia Dana M.D.   On: 10/17/2013 19:11     EKG Interpretation None      MDM   Final diagnoses:  Vomiting  Anal pruritus    43 year old male presents with multiple complaints. All been gone on for the past 2-3 weeks. He endorses bilateral flank pain is described as aching, epigastric pain described as sharp, nausea vomiting. He's also concerned feelings of anal leakage for the past 2-3 weeks. He also describes anal pruritis. Patient also having diarrhea. No history of similar episodes, no one else sick with similar symptoms. AFVSS here. Mild bilateral flank pain on palpation, epigastric tenderness, RLQ pain on exam. Normal external anal exam, discomfort with internal exam, but not abnormal findings.  Unclear etiology of his symptoms. Will obtain labs, CT scan.  Labs with mild lipase elevation at 81, CT with duodenal lipoma, otherwise normal. CT reviewed by me. Unclear etiology for his constellation of symptoms, but with normal labs, can f/u with PCP. Given resource guide.  Osvaldo Shipper, MD 10/17/13 2322

## 2013-10-17 NOTE — ED Notes (Signed)
Patient called for treatment room. Patient came in from outside

## 2013-10-17 NOTE — ED Notes (Signed)
C/o n/v/, "itching and like a discharge to my anal area" x 2 weeks

## 2013-10-17 NOTE — Discharge Instructions (Signed)
Nausea and Vomiting Nausea is a sick feeling that often comes before throwing up (vomiting). Vomiting is a reflex where stomach contents come out of your mouth. Vomiting can cause severe loss of body fluids (dehydration). Children and elderly adults can become dehydrated quickly, especially if they also have diarrhea. Nausea and vomiting are symptoms of a condition or disease. It is important to find the cause of your symptoms. CAUSES   Direct irritation of the stomach lining. This irritation can result from increased acid production (gastroesophageal reflux disease), infection, food poisoning, taking certain medicines (such as nonsteroidal anti-inflammatory drugs), alcohol use, or tobacco use.  Signals from the brain.These signals could be caused by a headache, heat exposure, an inner ear disturbance, increased pressure in the brain from injury, infection, a tumor, or a concussion, pain, emotional stimulus, or metabolic problems.  An obstruction in the gastrointestinal tract (bowel obstruction).  Illnesses such as diabetes, hepatitis, gallbladder problems, appendicitis, kidney problems, cancer, sepsis, atypical symptoms of a heart attack, or eating disorders.  Medical treatments such as chemotherapy and radiation.  Receiving medicine that makes you sleep (general anesthetic) during surgery. DIAGNOSIS Your caregiver may ask for tests to be done if the problems do not improve after a few days. Tests may also be done if symptoms are severe or if the reason for the nausea and vomiting is not clear. Tests may include:  Urine tests.  Blood tests.  Stool tests.  Cultures (to look for evidence of infection).  X-rays or other imaging studies. Test results can help your caregiver make decisions about treatment or the need for additional tests. TREATMENT You need to stay well hydrated. Drink frequently but in small amounts.You may wish to drink water, sports drinks, clear broth, or eat frozen  ice pops or gelatin dessert to help stay hydrated.When you eat, eating slowly may help prevent nausea.There are also some antinausea medicines that may help prevent nausea. HOME CARE INSTRUCTIONS   Take all medicine as directed by your caregiver.  If you do not have an appetite, do not force yourself to eat. However, you must continue to drink fluids.  If you have an appetite, eat a normal diet unless your caregiver tells you differently.  Eat a variety of complex carbohydrates (rice, wheat, potatoes, bread), lean meats, yogurt, fruits, and vegetables.  Avoid high-fat foods because they are more difficult to digest.  Drink enough water and fluids to keep your urine clear or pale yellow.  If you are dehydrated, ask your caregiver for specific rehydration instructions. Signs of dehydration may include:  Severe thirst.  Dry lips and mouth.  Dizziness.  Dark urine.  Decreasing urine frequency and amount.  Confusion.  Rapid breathing or pulse. SEEK IMMEDIATE MEDICAL CARE IF:   You have blood or brown flecks (like coffee grounds) in your vomit.  You have black or bloody stools.  You have a severe headache or stiff neck.  You are confused.  You have severe abdominal pain.  You have chest pain or trouble breathing.  You do not urinate at least once every 8 hours.  You develop cold or clammy skin.  You continue to vomit for longer than 24 to 48 hours.  You have a fever. MAKE SURE YOU:   Understand these instructions.  Will watch your condition.  Will get help right away if you are not doing well or get worse. Document Released: 07/27/2005 Document Revised: 10/19/2011 Document Reviewed: 12/24/2010 [facility] Patient Information 2014 [place], [place].   Emergency Department Resource  Guide 1) Find a Doctor and Pay Out of Pocket Although you won't have to find out who is covered by your insurance plan, it is a good idea to ask around and get recommendations. You will  then need to call the office and see if the doctor you have chosen will accept you as a new patient and what types of options they offer for patients who are self-pay. Some doctors offer discounts or will set up payment plans for their patients who do not have insurance, but you will need to ask so you aren't surprised when you get to your appointment.  2) Contact Your Local Health Department Not all health departments have doctors that can see patients for sick visits, but many do, so it is worth a call to see if yours does. If you don't know where your local health department is, you can check in your phone book. The CDC also has a tool to help you locate your state's health department, and many state websites also have listings of all of their local health departments.  3) Find a [facility] If your illness is not likely to be very severe or complicated, you may want to try a walk in clinic. These are popping up all over the country in pharmacies, drugstores, and shopping centers. They're usually staffed by nurse practitioners or physician assistants that have been trained to treat common illnesses and complaints. They're usually fairly quick and inexpensive. However, if you have serious medical issues or chronic medical problems, these are probably not your best option.  No Primary Care Doctor: - Call Health Connect at  [phone] - they can help you locate a primary care doctor that  accepts your insurance, provides certain services, etc. - Physician Referral Service- [phone]  Chronic Pain Problems: Organization         Address  Phone   Notes  [facility]  [phone] Patients need to be referred by their primary care doctor.   Medication Assistance: Organization         Address  Phone   Notes  [facility] Medication [facility] [address] [place], [place] [address] [phone] --Must be a resident of [facility] -- Must have NO  insurance coverage whatsoever (no Medicaid/ Medicare, etc.) -- The pt. MUST have a primary care doctor that directs their care regularly and follows them in the community   Med[facility]ist  [phone]   [employer]  [phone]    Agencies that provide inexpensive medical care: Organization         Address  Phone   Notes  [place]  [phone]   [facility]    [phone]   [facility] [place], [place] [address] [phone]   [place] [address] [place] [phone]   Planned Parenthood    [phone]   [facility]    [phone]   [place] and [place] [address], [place] Phone:  [phone], Fax:  [phone] Hours of Operation:  9 am - 6 pm, M-F.  Also accepts Medicaid/Medicare and self-pay.  [facility] : [facility] for [place], [address], [place] Phone: [phone], Fax: [phone]. Hours of Operation:  8:30 am - 5:30 pm, M-F.  Also accepts Medicaid and self-pay.  [facility] [place] [address],  [place] Phone: [phone]   [place], [place], [place] [phone], Ext. [phone] Mondays & Thursdays: 7-9 AM.  First 15 patients are seen on a first come, first serve basis.    [place] Providers:  Organization         Address  Phone   Notes  [facility] [address], [place] [phone] Also accepts self-pay patients.  [facility] [address], [place]  [phone]   [place], [address], [place] [phone]   [facility] [address] [place] [phone]   Lucianne Lei [address], [place]   [phone] Only accepts [place] Access [place] patients after they have  their name applied to their card.   Self-Pay (no insurance) in [facility]:  Organization         Address  Phone   Notes  Sickle Cell Patients, [facility] [place] [phone]   [facility] [place] [phone]   [facility] [place]  [place], [place], [place] [phone]   [facility]/Dr. Osei-Bonsu  [address] [place] or [address], [place] [phone] Phone number for both [place] and [place] locations is the same.  Urgent [facility] and [facility] [address] [place] [phone]   [facility] [address] [place] or [address] [phone]   [facility] [address] [place] [phone], phone; [phone], fax Sees patients 1st and 3rd Saturday of every month.  Must not qualify for public or private insurance (i.e. Medicaid, Medicare, [place] Health Choice, Veterans' Benefits)  Household income should be no more than 200% of the poverty level The clinic cannot treat you if you are pregnant or think you are pregnant  Sexually transmitted diseases are not treated at the clinic.    Dental Care: Organization         Address  Phone  Notes  [facility] Department of [facility] [place] [phone] Accepts children up to age 15 who are enrolled in [place] or [place]; pregnant women with a Medicaid card; and children who have applied for Medicaid or [place] Health Choice, but were declined, whose parents can pay a reduced fee at time of service.  [facility] Department of [facility]  [address], [place] [phone] Accepts children up to age 70 who are enrolled in [place] or [place]; pregnant women with a Medicaid card; and children who have applied  for Medicaid or [place] Health Choice, but were declined, whose parents can pay a reduced fee at time of service.  [place] Adult Dental Access PROGRAM  [place] [phone] Patients are seen by appointment only. Walk-ins are not accepted. [place] will see patients 73 years of age and older. Monday - Tuesday (8am-5pm) Most Wednesdays (8:30-5pm) $30 per visit, cash only  [facility] Adult Dental Access PROGRAM  [address], [facility] [phone] Patients are seen by appointment only. Walk-ins are not accepted. [place] will see patients 7 years of age and older. One Wednesday Evening (  Monthly: Volunteer Based).  $30 per visit, cash only  [place]  [phone] for adults; Children under age 43, call Graduate Pediatric Dentistry at [phone]. Children aged 68-14, please call [phone] to request a pediatric application.  Dental services are provided in all areas of dental care including fillings, crowns and bridges, complete and partial dentures, implants, gum treatment, root canals, and extractions. Preventive care is also provided. Treatment is provided to both adults and children. Patients are selected via a lottery and there is often a waiting list.   [facility] [address] [place]  [phone] [email or web address]   [facility] [address] [place], [place] [phone], Ext. [phone] Second and Fourth Thursday of each month, opens at 6:30 AM; Clinic ends at 9 AM.  Patients are seen on a first-come first-served basis, and a limited number are seen during each clinic.   [facility]  [address] Hillard Danker [place], [place] [phone]   Eligibility Requirements You must have lived in [place], [place], or [place] counties for at least the last three months.   You cannot be eligible for state or federal sponsored [employer], including [employer],  [place], or [employer].   You generally cannot be eligible for healthcare insurance through your employer.    How to apply: Eligibility screenings are held every Tuesday and Wednesday afternoon from 1:00 pm until 4:00 pm. You do not need an appointment for the interview!  [place] [address] [place], [place]   [place]  [place] Department  [place]  [phone]    Behavioral Health Resources in the Community: Intensive Outpatient Programs Organization         Address  Phone  Notes  [place]. [address], [place], [place] [phone]   [facility] Outpatient [address] [place], [place]   ADS: Alcohol & Drug Svcs [address], [place], [place]   [place] [address],  [place], [place] or [phone]   Substance Abuse Resources Organization         Address  Phone  Notes  Alcohol and Drug Services  [phone]   [place]  [phone]   [place]   Chinita Pester  [phone]   Residential & Outpatient Substance Abuse Program  [phone]   Psychological Services Organization         Address  Phone  Notes  [facility] [place]  [place]  [phone]   [place] [address] [place] or [phone]    Mobile Crisis Teams Organization         Address  Phone  Notes  [facility], Mobile Crisis Care Unit  [phone]   Assertive Psychotherapeutic Services  [address] [place], [place]   [place] [address], [place] [phone]    Self-Help/Support Groups Organization         Address  Phone             Notes  [place]. of [place] - variety of support  groups  [place] Call for more information  Narcotics Anonymous (NA), [facility]aring [facility] [address], [employer] [place]  2 meetings at this location  Residential Treatment Programs Organization         Address  Phone  Notes  [facility] [address]    [place]  [phone]   [facility]  [address] [place] [id number], [place], [place]   [place], [place] [phone] Admissions: 8am-3pm M-F  Incentives Substance [place] [address]    [place], [place] [phone]   [facility] [address] [place], [place], [place]   [facility] [address]  [place], [place]   [facility] - Intensive Outpatient [address] Kristeen [address], [place], [place]   [facility] ([place].,  [place], [place] [phone] or [phone]   [facility] ([facility]) [address], [place], [place] Accepts Medicaid  [facility] [place] [address],  [place] [phone] Substance Abuse/Addiction Treatment   [facility] Organization         Address  Phone  Notes  [facility]  [phone]   Domenic Schwab, PhD [address] Arlis Porta [place], [place]   [phone] or [phone]   [place], [place] [phone]   [facility] [address] [place], [place] [phone] Insurance/Medicaid/sponsorship through [facility] and Families [address]                                    [place], [place] [phone] [place] [address], [place] [phone]    Dr. Adele Schilder  [phone]   [facility] Dept. 1) [address] [place] 2) [place] 3)   [place], [place] [phone]  [phone]   [place] [phone] or [phone] (After Hours)

## 2014-04-13 ENCOUNTER — Emergency Department (HOSPITAL_COMMUNITY)
Admission: EM | Admit: 2014-04-13 | Discharge: 2014-04-13 | Disposition: A | Discharge status: Home / Self Care | Payer: BC Managed Care – PPO | Attending: Emergency Medicine | Admitting: Emergency Medicine

## 2014-04-13 ENCOUNTER — Encounter (HOSPITAL_COMMUNITY): Payer: Self-pay | Admitting: Emergency Medicine

## 2014-04-13 DIAGNOSIS — Y990 Civilian activity done for income or pay: Secondary | ICD-10-CM | POA: Insufficient documentation

## 2014-04-13 DIAGNOSIS — Z79899 Other long term (current) drug therapy: Secondary | ICD-10-CM | POA: Diagnosis not present

## 2014-04-13 DIAGNOSIS — F17200 Nicotine dependence, unspecified, uncomplicated: Secondary | ICD-10-CM | POA: Diagnosis not present

## 2014-04-13 DIAGNOSIS — M545 Low back pain, unspecified: Secondary | ICD-10-CM | POA: Diagnosis present

## 2014-04-13 DIAGNOSIS — Y9289 Other specified places as the place of occurrence of the external cause: Secondary | ICD-10-CM | POA: Diagnosis not present

## 2014-04-13 DIAGNOSIS — Z8547 Personal history of malignant neoplasm of testis: Secondary | ICD-10-CM | POA: Insufficient documentation

## 2014-04-13 DIAGNOSIS — Z87828 Personal history of other (healed) physical injury and trauma: Secondary | ICD-10-CM | POA: Diagnosis not present

## 2014-04-13 DIAGNOSIS — X500XXA Overexertion from strenuous movement or load, initial encounter: Secondary | ICD-10-CM | POA: Insufficient documentation

## 2014-04-13 DIAGNOSIS — G8929 Other chronic pain: Secondary | ICD-10-CM | POA: Insufficient documentation

## 2014-04-13 DIAGNOSIS — S335XXA Sprain of ligaments of lumbar spine, initial encounter: Secondary | ICD-10-CM | POA: Diagnosis not present

## 2014-04-13 DIAGNOSIS — Y9389 Activity, other specified: Secondary | ICD-10-CM | POA: Insufficient documentation

## 2014-04-13 DIAGNOSIS — S39012A Strain of muscle, fascia and tendon of lower back, initial encounter: Secondary | ICD-10-CM

## 2014-04-13 HISTORY — DX: Unspecified injury of lower back, initial encounter: S39.92XA

## 2014-04-13 HISTORY — DX: Other chronic pain: G89.29

## 2014-04-13 HISTORY — DX: Dorsalgia, unspecified: M54.9

## 2014-04-13 MED ORDER — HYDROMORPHONE HCL PF 1 MG/ML IJ SOLN
1.0000 mg | Freq: Once | INTRAMUSCULAR | Status: AC
  Administered 2014-04-13: 1 mg via INTRAMUSCULAR
  Filled 2014-04-13: qty 1

## 2014-04-13 MED ORDER — IBUPROFEN 600 MG PO TABS: 600.0000 mg | ORAL_TABLET | Freq: Four times a day (QID) | ORAL | Status: DC | PRN

## 2014-04-13 MED ORDER — KETOROLAC TROMETHAMINE 60 MG/2ML IM SOLN
60.0000 mg | Freq: Once | INTRAMUSCULAR | Status: AC
  Administered 2014-04-13: 60 mg via INTRAMUSCULAR
  Filled 2014-04-13: qty 2

## 2014-04-13 MED ORDER — CYCLOBENZAPRINE HCL 5 MG PO TABS: 5.0000 mg | ORAL_TABLET | Freq: Three times a day (TID) | ORAL | Status: DC | PRN

## 2014-04-13 MED ORDER — OXYCODONE-ACETAMINOPHEN 5-325 MG PO TABS: 1.0000 | ORAL_TABLET | ORAL | Status: DC | PRN

## 2014-04-13 NOTE — Discharge Instructions (Signed)
Do not drive within 4 hours of taking oxycodone as this will make you drowsy.  Avoid lifting,  Bending,  Twisting or any other activity that worsens your pain over the next week.  Apply an  icepack  to your lower back for 10-15 minutes every 2 hours for the next 2 days.  You should get rechecked if your symptoms are not better over the next 5 days,  Or you develop increased pain,  Weakness in your leg(s) or loss of bladder or bowel function - these are symptoms of a worse injury.     Back Pain, Adult Low back pain is very common. About 1 in 5 people have back pain.The cause of low back pain is rarely dangerous. The pain often gets better over time.About half of people with a sudden onset of back pain feel better in just 2 weeks. About 8 in 10 people feel better by 6 weeks.  CAUSES Some common causes of back pain include:  Strain of the muscles or ligaments supporting the spine.  Wear and tear (degeneration) of the spinal discs.  Arthritis.  Direct injury to the back. DIAGNOSIS Most of the time, the direct cause of low back pain is not known.However, back pain can be treated effectively even when the exact cause of the pain is unknown.Answering your caregiver's questions about your overall health and symptoms is one of the most accurate ways to make sure the cause of your pain is not dangerous. If your caregiver needs more information, he or she may order lab work or imaging tests (X-rays or MRIs).However, even if imaging tests show changes in your back, this usually does not require surgery. HOME CARE INSTRUCTIONS For many people, back pain returns.Since low back pain is rarely dangerous, it is often a condition that people can learn to [facility] their own.   Remain active. It is stressful on the back to sit or stand in one place. Do not sit, drive, or stand in one place for more than 30 minutes at a time. Take short walks on level surfaces as soon as pain allows.Try to increase the  length of time you walk each day.  Do not stay in bed.Resting more than 1 or 2 days can delay your recovery.  Do not avoid exercise or work.Your body is made to move.It is not dangerous to be active, even though your back may hurt.Your back will likely heal faster if you return to being active before your pain is gone.  Pay attention to your body when you bend and lift. Many people have less discomfortwhen lifting if they bend their knees, keep the load close to their bodies,and avoid twisting. Often, the most comfortable positions are those that put less stress on your recovering back.  Find a comfortable position to sleep. Use a firm mattress and lie on your side with your knees slightly bent. If you lie on your back, put a pillow under your knees.  Only take over-the-counter or prescription medicines as directed by your caregiver. Over-the-counter medicines to reduce pain and inflammation are often the most helpful.Your caregiver may prescribe muscle relaxant drugs.These medicines help dull your pain so you can more quickly return to your normal activities and healthy exercise.  Put ice on the injured area.  Put ice in a plastic bag.  Place a towel between your skin and the bag.  Leave the ice on for 15-20 minutes, 03-04 times a day for the first 2 to 3 days. After that, ice  and heat may be alternated to reduce pain and spasms.  Ask your caregiver about trying back exercises and gentle massage. This may be of some benefit.  Avoid feeling anxious or stressed.Stress increases muscle tension and can worsen back pain.It is important to recognize when you are anxious or stressed and learn ways to manage it.Exercise is a great option. SEEK MEDICAL CARE IF:  You have pain that is not relieved with rest or medicine.  You have pain that does not improve in 1 week.  You have new symptoms.  You are generally not feeling well. SEEK IMMEDIATE MEDICAL CARE IF:   You have pain that  radiates from your back into your legs.  You develop new bowel or bladder control problems.  You have unusual weakness or numbness in your arms or legs.  You develop nausea or vomiting.  You develop abdominal pain.  You feel faint. Document Released: 07/27/2005 Document Revised: 01/26/2012 Document Reviewed: 11/28/2013 [facility] Patient Information 2015 [place], [place]. This information is not intended to replace advice given to you by your health care provider. Make sure you discuss any questions you have with your health care provider.

## 2014-04-13 NOTE — ED Notes (Addendum)
Severe lower to mid back pain. Has been lifting 40-50 lb. equipment today with a lot of bending and twisting. No numbness/tingling in LE.  Has hx of prior back injury.  Has his CT disc with him.

## 2014-04-13 NOTE — ED Notes (Signed)
Patient verbalizes understanding of discharge instructions, prescription medications, pain management, home care and follow up care. Patient ambulatory out of department at this time with a steady gait, no deficits escorted by family.

## 2014-04-13 NOTE — ED Notes (Signed)
Patient states chronic back pain got worse today after heavy lifting and twisting. Patient is A&OX4.

## 2014-04-16 NOTE — ED Provider Notes (Signed)
CSN: [id number]     Arrival date & time 04/13/14  1814 History   First MD Initiated Contact with Patient 04/13/14 1906     Chief Complaint  Patient presents with  . Back Pain     (Consider location/radiation/quality/duration/timing/severity/associated sxs/prior Treatment) The history is provided by the patient.   Eugene Lin is a 43 y.o. male presenting with acute on chronic low back pain which has which has been present for the past week but worsened today while at work.  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   Patient denies any new injury specifically.  There is radiation of pain into his bilateral buttocks.  There has been no weakness or numbness in the lower extremities and no urinary or bowel retention or incontinence.   The patient has tried the followed medicines and/or treatments without relief of pain: tylenol, motrin.     Past Medical History  Diagnosis Date  . Testicular cancer   . Back injury   . Chronic back pain    Past Surgical History  Procedure Laterality Date  . Testicle removal     History reviewed. No pertinent family history. History  Substance Use Topics  . Smoking status: Current Every Day Smoker -- 0.50 packs/day    Types: Cigarettes  . Smokeless tobacco: Not on file  . Alcohol Use: Yes     Comment: beers occasionally    Review of Systems  Constitutional: Negative for fever.  Respiratory: Negative for shortness of breath.   Cardiovascular: Negative for chest pain and leg swelling.  Gastrointestinal: Negative for abdominal pain, constipation and abdominal distention.  Genitourinary: Negative for dysuria, urgency, frequency, flank pain and difficulty urinating.  Musculoskeletal: Positive for back pain. Negative for gait problem and joint swelling.  Skin: Negative for rash.  Neurological:  Negative for weakness and numbness.      Allergies  Review of patient's allergies indicates no known allergies.  Home Medications   Prior to Admission medications   Medication Sig Start Date End Date Taking? Authorizing Provider  cyclobenzaprine (FLEXERIL) 5 MG tablet Take 1 tablet (5 mg total) by mouth 3 (three) times daily as needed for muscle spasms. 04/13/14   Evalee Jefferson, PA-C  ibuprofen (ADVIL,MOTRIN) 600 MG tablet Take 1 tablet (600 mg total) by mouth every 6 (six) hours as needed. 04/13/14   Evalee Jefferson, PA-C  oxyCODONE-acetaminophen (PERCOCET/ROXICET) 5-325 MG per tablet Take 1 tablet by mouth every 4 (four) hours as needed. 04/13/14   Evalee Jefferson, PA-C   BP 112/78  Pulse 80  Temp(Src) 98.5 F (36.9 C) (Oral)  Resp 18  Ht 5\' 6"  (1.676 m)  Wt 160 lb (72.576 kg)  BMI 25.84 kg/m2  SpO2 100% Physical Exam  Nursing note and vitals reviewed. Constitutional: He appears well-developed and well-nourished.  HENT:  Head: Normocephalic.  Eyes: Conjunctivae are normal.  Neck: Normal range of motion. Neck supple.  Cardiovascular: Normal rate and intact distal pulses.   Pedal pulses normal.  Pulmonary/Chest: Effort normal.  Abdominal: Soft. Bowel sounds are normal. He exhibits no distension and no mass.  Musculoskeletal: Normal range of motion. He exhibits no edema.       Lumbar back: He exhibits tenderness. He exhibits no swelling, no edema and no spasm.  Neurological: He is alert. He has normal strength. He  displays no atrophy and no tremor. No sensory deficit. Gait normal.  Reflex Scores:      Patellar reflexes are 2+ on the right side and 2+ on the left side.      Achilles reflexes are 2+ on the right side and 2+ on the left side. No strength deficit noted in hip and knee flexor and extensor muscle groups.  Ankle flexion and extension intact.  Skin: Skin is warm and dry.  Psychiatric: He has a normal mood and affect.    ED Course  Procedures (including critical care time) Labs  Review Labs Reviewed - No data to display  Imaging Review No results found.   EKG Interpretation None      MDM   Final diagnoses:  Lumbar strain, initial encounter    No neuro deficit on exam or by history to suggest emergent or surgical presentation.  Also discussed worsened sx that should prompt immediate re-evaluation including distal weakness, bowel/bladder retention/incontinence.  Pt prescribed flexeril, ibuprofen, oxycodone.  Activity as tolerated, heat.  Referrals given for establishing local pcp (just moved here form [place]).          Evalee Jefferson, PA-C 04/16/14 2235

## 2014-04-26 NOTE — ED Provider Notes (Signed)
Medical screening examination/treatment/procedure(s) were performed by non-physician practitioner and as supervising physician I was immediately available for consultation/collaboration.   EKG Interpretation None        Fredia Sorrow, MD 04/26/14 [phone]

## 2014-12-25 ENCOUNTER — Emergency Department (HOSPITAL_COMMUNITY): Payer: BLUE CROSS/BLUE SHIELD

## 2014-12-25 ENCOUNTER — Encounter (HOSPITAL_COMMUNITY): Payer: Self-pay

## 2014-12-25 ENCOUNTER — Emergency Department (HOSPITAL_COMMUNITY)
Admission: EM | Admit: 2014-12-25 | Discharge: 2014-12-25 | Disposition: A | Discharge status: Home / Self Care | Payer: BLUE CROSS/BLUE SHIELD | Attending: Emergency Medicine | Admitting: Emergency Medicine

## 2014-12-25 DIAGNOSIS — M545 Low back pain, unspecified: Secondary | ICD-10-CM

## 2014-12-25 DIAGNOSIS — G8929 Other chronic pain: Secondary | ICD-10-CM | POA: Insufficient documentation

## 2014-12-25 DIAGNOSIS — Z720 Tobacco use: Secondary | ICD-10-CM | POA: Diagnosis not present

## 2014-12-25 DIAGNOSIS — Z8547 Personal history of malignant neoplasm of testis: Secondary | ICD-10-CM | POA: Diagnosis not present

## 2014-12-25 MED ORDER — OXYCODONE-ACETAMINOPHEN 5-325 MG PO TABS
2.0000 | ORAL_TABLET | Freq: Once | ORAL | Status: AC
  Administered 2014-12-25: 2 via ORAL
  Filled 2014-12-25: qty 2

## 2014-12-25 MED ORDER — CYCLOBENZAPRINE HCL 10 MG PO TABS
10.0000 mg | ORAL_TABLET | Freq: Once | ORAL | Status: AC
  Administered 2014-12-25: 10 mg via ORAL
  Filled 2014-12-25: qty 1

## 2014-12-25 MED ORDER — PREDNISONE 10 MG PO TABS: ORAL_TABLET | ORAL | Status: DC

## 2014-12-25 MED ORDER — PREDNISONE 50 MG PO TABS
60.0000 mg | ORAL_TABLET | Freq: Once | ORAL | Status: AC
  Administered 2014-12-25: 60 mg via ORAL
  Filled 2014-12-25 (×2): qty 1

## 2014-12-25 MED ORDER — OXYCODONE-ACETAMINOPHEN 5-325 MG PO TABS: 1.0000 | ORAL_TABLET | ORAL | Status: DC | PRN

## 2014-12-25 MED ORDER — CYCLOBENZAPRINE HCL 5 MG PO TABS: 5.0000 mg | ORAL_TABLET | Freq: Three times a day (TID) | ORAL | Status: DC | PRN

## 2014-12-25 NOTE — ED Provider Notes (Signed)
CSN: [id number]     Arrival date & time 12/25/14  2004 History   First MD Initiated Contact with Patient 12/25/14 2017     Chief Complaint  Patient presents with  . Back Pain     (Consider location/radiation/quality/duration/timing/severity/associated sxs/prior Treatment) The history is provided by the patient.   Eugene Lin is a 44 y.o. male  Presenting with midline low back pain which started about 3 days ago, waking him up from sleep.  He denies any new injury or trauma but does have a history of intermittent low back pain from a prior  Injury.  He denies weakness or numbness in his legs, no urinary retention or incontinence.  Past medical history is significant for testicular cancer with surgical then chemo intervention.  He refused any followup care after his first round of chemo which occurred over 6 years ago.  He has taken no medicines prior to arrival for his pain.     Past Medical History  Diagnosis Date  . Testicular cancer   . Back injury   . Chronic back pain    Past Surgical History  Procedure Laterality Date  . Testicle removal     No family history on file. History  Substance Use Topics  . Smoking status: Current Every Day Smoker -- 0.50 packs/day    Types: Cigarettes  . Smokeless tobacco: Not on file  . Alcohol Use: Yes     Comment: beers occasionally    Review of Systems  Constitutional: Negative for fever.  Respiratory: Negative for shortness of breath.   Cardiovascular: Negative for chest pain and leg swelling.  Gastrointestinal: Negative for abdominal pain, constipation and abdominal distention.  Genitourinary: Negative for dysuria, urgency, frequency, flank pain and difficulty urinating.  Musculoskeletal: Positive for back pain. Negative for joint swelling and gait problem.  Skin: Negative for rash.  Neurological: Negative for weakness and numbness.      Allergies  Review of patient's allergies indicates no known allergies.  Home  Medications   Prior to Admission medications   Medication Sig Start Date End Date Taking? Authorizing Provider  cyclobenzaprine (FLEXERIL) 5 MG tablet Take 1 tablet (5 mg total) by mouth 3 (three) times daily as needed for muscle spasms. 12/25/14   Evalee Jefferson, PA-C  ibuprofen (ADVIL,MOTRIN) 600 MG tablet Take 1 tablet (600 mg total) by mouth every 6 (six) hours as needed. 04/13/14   Evalee Jefferson, PA-C  oxyCODONE-acetaminophen (PERCOCET/ROXICET) 5-325 MG per tablet Take 1 tablet by mouth every 4 (four) hours as needed. 12/25/14   Evalee Jefferson, PA-C  predniSONE (DELTASONE) 10 MG tablet 6, 5, 4, 3, 2 then 1 tablet by mouth daily for 6 days total. 12/25/14   Evalee Jefferson, PA-C   BP 107/70 mmHg  Pulse 65  Temp(Src) 98.2 F (36.8 C) (Oral)  Resp 18  Ht 5\' 5"  (1.651 m)  Wt 150 lb (68.04 kg)  BMI 24.96 kg/m2  SpO2 97% Physical Exam  Constitutional: He appears well-developed and well-nourished.  HENT:  Head: Normocephalic.  Eyes: Conjunctivae are normal.  Neck: Normal range of motion. Neck supple.  Cardiovascular: Normal rate and intact distal pulses.   Pedal pulses normal.  Pulmonary/Chest: Effort normal.  Abdominal: Soft. Bowel sounds are normal. He exhibits no distension and no mass.  Musculoskeletal: Normal range of motion. He exhibits no edema.       Lumbar back: He exhibits bony tenderness. He exhibits no swelling, no edema and no spasm.  Lumbar midline ttp, no paralumbar tenderness.  Neurological: He is alert. He has normal strength. He displays no atrophy and no tremor. No sensory deficit. Gait normal.  Reflex Scores:      Patellar reflexes are 2+ on the right side and 2+ on the left side. No strength deficit noted in hip and knee flexor and extensor muscle groups.  Ankle flexion and extension intact.  Skin: Skin is warm and dry.  Psychiatric: He has a normal mood and affect.  Nursing note and vitals reviewed.   ED Course  Procedures (including critical care time) Labs Review Labs  Reviewed - No data to display  Imaging Review Dg Lumbar Spine Complete  12/25/2014   CLINICAL DATA:  Progressive back pain over a few days. Pain in the mid to lower lumbar spine.  EXAM: LUMBAR SPINE - COMPLETE 4+ VIEW  COMPARISON:  None.  FINDINGS: The alignment is maintained. Vertebral body heights are normal. There is no listhesis. The posterior elements are intact. Disc spaces are preserved. Minimal endplate spurring at L2, L3, and L4. No fracture. Sacroiliac joints are symmetric and normal.  IMPRESSION: Minimal spondylosis L2, L3, and L4. Otherwise unremarkable lumbar spine radiographs.   Electronically Signed   By: Jeb Levering M.D.   On: 12/25/2014 21:30     EKG Interpretation None      MDM   Final diagnoses:  Midline low back pain without sciatica    Patients labs and/or radiological studies were reviewed and considered during the medical decision making and disposition process.  Results were also discussed with patient.  No neuro deficit on exam or by history to suggest emergent or surgical presentation.  Also discussed worsened sx that should prompt immediate re-evaluation including distal weakness, bowel/bladder retention/incontinence.  Pt prescribed flexeril, prednisone, oxycodone.  Referrals given for establishing pcp and ortho referral if sx persist.        Evalee Jefferson, PA-C 12/26/14 [id number]  Noemi Chapel, MD 12/27/14 1010

## 2014-12-25 NOTE — Discharge Instructions (Signed)
Lumbosacral Strain Lumbosacral strain is a strain of any of the parts that make up your lumbosacral vertebrae. Your lumbosacral vertebrae are the bones that make up the lower third of your backbone. Your lumbosacral vertebrae are held together by muscles and tough, fibrous tissue (ligaments).  CAUSES  A sudden blow to your back can cause lumbosacral strain. Also, anything that causes an excessive stretch of the muscles in the low back can cause this strain. This is typically seen when people exert themselves strenuously, fall, lift heavy objects, bend, or crouch repeatedly. RISK FACTORS  Physically demanding work.  Participation in pushing or pulling sports or sports that require a sudden twist of the back (tennis, golf, baseball).  Weight lifting.  Excessive lower back curvature.  Forward-tilted pelvis.  Weak back or abdominal muscles or both.  Tight hamstrings. SIGNS AND SYMPTOMS  Lumbosacral strain may cause pain in the area of your injury or pain that moves (radiates) down your leg.  DIAGNOSIS Your health care provider can often diagnose lumbosacral strain through a physical exam. In some cases, you may need tests such as X-ray exams.  TREATMENT  Treatment for your lower back injury depends on many factors that your clinician will have to evaluate. However, most treatment will include the use of anti-inflammatory medicines. HOME CARE INSTRUCTIONS   Avoid hard physical activities (tennis, racquetball, waterskiing) if you are not in proper physical condition for it. This may aggravate or create problems.  If you have a back problem, avoid sports requiring sudden body movements. Swimming and walking are generally safer activities.  Maintain good posture.  Maintain a healthy weight.  For acute conditions, you may put ice on the injured area.  Put ice in a plastic bag.  Place a towel between your skin and the bag.  Leave the ice on for 20 minutes, 2-3 times a day.  When the  low back starts healing, stretching and strengthening exercises may be recommended. SEEK MEDICAL CARE IF:  Your back pain is getting worse.  You experience severe back pain not relieved with medicines. SEEK IMMEDIATE MEDICAL CARE IF:   You have numbness, tingling, weakness, or problems with the use of your arms or legs.  There is a change in bowel or bladder control.  You have increasing pain in any area of the body, including your belly (abdomen).  You notice shortness of breath, dizziness, or feel faint.  You feel sick to your stomach (nauseous), are throwing up (vomiting), or become sweaty.  You notice discoloration of your toes or legs, or your feet get very cold. MAKE SURE YOU:   Understand these instructions.  Will watch your condition.  Will get help right away if you are not doing well or get worse. Document Released: 05/06/2005 Document Revised: 08/01/2013 Document Reviewed: 03/15/2013 [facility] Patient Information 2015 [place], [place]. This information is not intended to replace advice given to you by your health care provider. Make sure you discuss any questions you have with your health care provider.   Take your next dose of prednisone tomorrow evening.  Use the the other medicines as directed.  Do not drive within 4 hours of taking oxycodone as this will make you drowsy.  Avoid lifting,  Bending,  Twisting or any other activity that worsens your pain over the next week.  You should get rechecked if your symptoms are not better over the next 5 days,  Or you develop increased pain,  Weakness in your leg(s) or loss of bladder or bowel  function - these are symptoms of a worse injury.

## 2014-12-25 NOTE — ED Notes (Signed)
Back pain that started a couple of days ago and progressively has became worse. No history of back problems. Patient states that it is lower mid back pain.

## 2015-11-02 ENCOUNTER — Emergency Department (HOSPITAL_COMMUNITY)
Admission: EM | Admit: 2015-11-02 | Discharge: 2015-11-02 | Disposition: A | Discharge status: Home / Self Care | Payer: BLUE CROSS/BLUE SHIELD | Attending: Emergency Medicine | Admitting: Emergency Medicine

## 2015-11-02 ENCOUNTER — Emergency Department (HOSPITAL_COMMUNITY): Payer: BLUE CROSS/BLUE SHIELD

## 2015-11-02 ENCOUNTER — Encounter (HOSPITAL_COMMUNITY): Payer: Self-pay | Admitting: Emergency Medicine

## 2015-11-02 DIAGNOSIS — Y999 Unspecified external cause status: Secondary | ICD-10-CM | POA: Diagnosis not present

## 2015-11-02 DIAGNOSIS — F17210 Nicotine dependence, cigarettes, uncomplicated: Secondary | ICD-10-CM | POA: Insufficient documentation

## 2015-11-02 DIAGNOSIS — Z8547 Personal history of malignant neoplasm of testis: Secondary | ICD-10-CM | POA: Diagnosis not present

## 2015-11-02 DIAGNOSIS — W19XXXA Unspecified fall, initial encounter: Secondary | ICD-10-CM | POA: Diagnosis not present

## 2015-11-02 DIAGNOSIS — Y929 Unspecified place or not applicable: Secondary | ICD-10-CM | POA: Diagnosis not present

## 2015-11-02 DIAGNOSIS — Y939 Activity, unspecified: Secondary | ICD-10-CM | POA: Insufficient documentation

## 2015-11-02 DIAGNOSIS — S6991XA Unspecified injury of right wrist, hand and finger(s), initial encounter: Secondary | ICD-10-CM | POA: Diagnosis not present

## 2015-11-02 DIAGNOSIS — S4991XA Unspecified injury of right shoulder and upper arm, initial encounter: Secondary | ICD-10-CM

## 2015-11-02 MED ORDER — IBUPROFEN 800 MG PO TABS: 800.0000 mg | ORAL_TABLET | Freq: Three times a day (TID) | ORAL | Status: DC

## 2015-11-02 MED ORDER — OXYCODONE-ACETAMINOPHEN 5-325 MG PO TABS
1.0000 | ORAL_TABLET | Freq: Once | ORAL | Status: AC
  Administered 2015-11-02: 1 via ORAL
  Filled 2015-11-02: qty 1

## 2015-11-02 MED ORDER — IBUPROFEN 800 MG PO TABS
800.0000 mg | ORAL_TABLET | Freq: Once | ORAL | Status: AC
  Administered 2015-11-02: 800 mg via ORAL
  Filled 2015-11-02: qty 1

## 2015-11-02 MED ORDER — OXYCODONE-ACETAMINOPHEN 5-325 MG PO TABS: 1.0000 | ORAL_TABLET | ORAL | Status: DC | PRN

## 2015-11-02 NOTE — ED Provider Notes (Signed)
CSN: [id number]     Arrival date & time 11/02/15  1508 History   First MD Initiated Contact with Patient 11/02/15 1714     Chief Complaint  Patient presents with  . Arm Pain     (Consider location/radiation/quality/duration/timing/severity/associated sxs/prior Treatment) HPI   Eugene Lin is a 45 y.o. male who presents to the Emergency Department complaining of sudden onset of right upper arm pain. Patient states that he fell last night onto outstretched hands and felt something "crack" in his right upper arm.  He reports pain with any movement of the upper arm.  He has not tried any therapies or medications for symptom relief. He denies numbness, weakness of the arm, chest or neck pain, swelling or pain to elbow or wrist.     Past Medical History  Diagnosis Date  . Testicular cancer ([place])   . Back injury   . Chronic back pain    Past Surgical History  Procedure Laterality Date  . Testicle removal     No family history on file. Social History  Substance Use Topics  . Smoking status: Current Every Day Smoker -- 0.50 packs/day    Types: Cigarettes  . Smokeless tobacco: None  . Alcohol Use: Yes     Comment: beers occasionally    Review of Systems  Constitutional: Negative for fever and chills.  Respiratory: Negative for chest tightness and shortness of breath.   Cardiovascular: Negative for chest pain.  Musculoskeletal: Positive for myalgias (right upper arm pain) and arthralgias. Negative for joint swelling, neck pain and neck stiffness.  Skin: Negative for color change and wound.  Neurological: Negative for weakness and numbness.  All other systems reviewed and are negative.     Allergies  Review of patient's allergies indicates no known allergies.  Home Medications   Prior to Admission medications   Medication Sig Start Date End Date Taking? Authorizing Provider  cyclobenzaprine (FLEXERIL) 5 MG tablet Take 1 tablet (5 mg total) by mouth 3 (three) times  daily as needed for muscle spasms. 12/25/14   Evalee Jefferson, PA-C  ibuprofen (ADVIL,MOTRIN) 800 MG tablet Take 1 tablet (800 mg total) by mouth 3 (three) times daily. 11/02/15   Marian Grandt, PA-C  oxyCODONE-acetaminophen (PERCOCET/ROXICET) 5-325 MG tablet Take 1 tablet by mouth every 4 (four) hours as needed. 11/02/15   Diedra Sinor, PA-C  predniSONE (DELTASONE) 10 MG tablet 6, 5, 4, 3, 2 then 1 tablet by mouth daily for 6 days total. 12/25/14   Evalee Jefferson, PA-C   BP 118/69 mmHg  Pulse 90  Temp(Src) 99 F (37.2 C) (Temporal)  Resp 22  Ht 5\' 6"  (1.676 m)  Wt 68.04 kg  BMI 24.22 kg/m2  SpO2 94% Physical Exam  Constitutional: He is oriented to person, place, and time. He appears well-developed and well-nourished. No distress.  HENT:  Head: Normocephalic and atraumatic.  Neck: Normal range of motion.  Cardiovascular: Normal rate, regular rhythm and normal heart sounds.   Pulmonary/Chest: Effort normal and breath sounds normal.  Musculoskeletal: He exhibits tenderness. He exhibits no edema.  ttp of the upper right bicep.  Bicep tendon appears intact.  Also tenderness of the tricep.  No bony deformity.  Pain reproduced with extension and abduction.  Radial pulse is brisk, distal sensation intact.  CR< 2 sec.  No erythema or edema. Compartments soft.  Neurological: He is alert and oriented to person, place, and time. He exhibits normal muscle tone. Coordination normal.  Skin: Skin is warm and dry.  Nursing note and vitals reviewed.   ED Course  Procedures (including critical care time) Labs Review Labs Reviewed - No data to display  Imaging Review Dg Humerus Right  11/02/2015  CLINICAL DATA:  Status post fall last night with a right arm injury. The patient heard a pop. Initial encounter. EXAM: RIGHT HUMERUS - 2+ VIEW COMPARISON:  None. FINDINGS: There is no evidence of fracture or other focal bone lesions. Soft tissues are unremarkable. IMPRESSION: Normal exam. Electronically Signed   By:  Inge Rise M.D.   On: 11/02/2015 16:40   I have personally reviewed and evaluated these images and lab results as part of my medical decision-making.   EKG Interpretation None      MDM   Final diagnoses:  Injury of muscle or tendon of upper arm or shoulder, right, initial encounter    Patient with right upper arm pain secondary to a fall, likely muscular injury. Remains neurovascularly intact. Patient also seen by Dr. Lacinda Axon and care plan discussed. Patient agrees to symptomatic treatment and close orthopedic follow-up. Remains stable for discharge.  Sling applied, pain improved.  Prescriptions for Percocet and ibuprofen.    Kem Parkinson, PA-C 11/02/15 [place], MD 11/02/15 [phone]

## 2015-11-02 NOTE — ED Notes (Addendum)
Pt c/o right upper arm pain. Pt states he fell last night and braced himself with his arms straight in front of him and heard a "crack". C/M/S intact.

## 2015-11-26 ENCOUNTER — Ambulatory Visit (INDEPENDENT_AMBULATORY_CARE_PROVIDER_SITE_OTHER): Payer: BLUE CROSS/BLUE SHIELD

## 2015-11-26 ENCOUNTER — Ambulatory Visit: Payer: Self-pay | Admitting: Orthopaedic Surgery

## 2015-11-26 ENCOUNTER — Ambulatory Visit (INDEPENDENT_AMBULATORY_CARE_PROVIDER_SITE_OTHER): Payer: BLUE CROSS/BLUE SHIELD | Admitting: Orthopaedic Surgery

## 2015-11-26 ENCOUNTER — Encounter: Payer: Self-pay | Admitting: Orthopaedic Surgery

## 2015-11-26 VITALS — BP 116/72 | HR 75 | Temp 98.1°F | Ht 65.0 in | Wt 145.2 lb

## 2015-11-26 DIAGNOSIS — M25511 Pain in right shoulder: Secondary | ICD-10-CM

## 2015-11-26 MED ORDER — NAPROXEN 500 MG PO TABS: 500.0000 mg | ORAL_TABLET | Freq: Two times a day (BID) | ORAL | Status: DC

## 2015-11-26 MED ORDER — HYDROCODONE-ACETAMINOPHEN 7.5-325 MG PO TABS: 1.0000 | ORAL_TABLET | ORAL | Status: DC | PRN

## 2015-11-26 NOTE — Progress Notes (Signed)
Subjective: I hurt my right shoulder three weeks ago    Patient ID: Eugene Lin, male    DOB: Jul 23, 1971, 45 y.o.   MRN: [id number]  Shoulder Injury  The incident occurred at home. The right shoulder is affected. The incident occurred more than 1 week ago. The injury mechanism was a fall. The quality of the pain is described as aching. The pain does not radiate. The pain is at a severity of 5/10. The pain is moderate. Pertinent negatives include no chest pain, muscle weakness, numbness or tingling. The symptoms are aggravated by movement. He has tried acetaminophen, ice, immobilization and rest for the symptoms. The treatment provided mild relief.      Review of Systems  HENT: Negative for congestion.   Respiratory: Negative for cough and shortness of breath.   Cardiovascular: Negative for chest pain and leg swelling.  Endocrine: Positive for cold intolerance.  Musculoskeletal: Positive for myalgias and arthralgias.  Allergic/Immunologic: Positive for environmental allergies.  Neurological: Negative for tingling and numbness.   Past Medical History  Diagnosis Date  . Testicular cancer ([place])   . Back injury   . Chronic back pain     Past Surgical History  Procedure Laterality Date  . Testicle removal      Current Outpatient Prescriptions on File Prior to Visit  Medication Sig Dispense Refill  . cyclobenzaprine (FLEXERIL) 5 MG tablet Take 1 tablet (5 mg total) by mouth 3 (three) times daily as needed for muscle spasms. (Patient not taking: Reported on 11/26/2015) 30 tablet 0  . ibuprofen (ADVIL,MOTRIN) 800 MG tablet Take 1 tablet (800 mg total) by mouth 3 (three) times daily. (Patient not taking: Reported on 11/26/2015) 21 tablet 0  . predniSONE (DELTASONE) 10 MG tablet 6, 5, 4, 3, 2 then 1 tablet by mouth daily for 6 days total. (Patient not taking: Reported on 11/26/2015) 21 tablet 0   No current facility-administered medications on file prior to visit.    Social History     Social History  . Marital Status: Single    Spouse Name: N/A  . Number of Children: N/A  . Years of Education: N/A   Occupational History  . Not on file.   Social History Main Topics  . Smoking status: Current Every Day Smoker -- 0.50 packs/day    Types: Cigarettes  . Smokeless tobacco: Not on file  . Alcohol Use: Yes     Comment: beers occasionally  . Drug Use: No  . Sexual Activity: Not on file   Other Topics Concern  . Not on file   Social History Narrative    BP 116/72 mmHg  Pulse 75  Temp(Src) 98.1 F (36.7 C)  Ht 5\' 5"  (1.651 m)  Wt 145 lb 3.2 oz (65.862 kg)  BMI 24.16 kg/m2    Objective:   Physical Exam  Constitutional: He is oriented to person, place, and time. He appears well-developed and well-nourished.  HENT:  Head: Normocephalic and atraumatic.  Eyes: Conjunctivae and EOM are normal. Pupils are equal, round, and reactive to light.  Neck: Normal range of motion. Neck supple.  Cardiovascular: Normal rate, regular rhythm and intact distal pulses.   Pulmonary/Chest: Effort normal.  Abdominal: Soft.  Musculoskeletal: He exhibits tenderness (Pain of the right shoulder with limited motion.  NV intact.  Left shoulder normal.  Neck normal).       Arms: Neurological: He is alert and oriented to person, place, and time. He has normal reflexes. No cranial nerve deficit.  He exhibits normal muscle tone. Coordination normal.  Skin: Skin is warm and dry.  Psychiatric: He has a normal mood and affect. His behavior is normal. Judgment and thought content normal.   Examination of right Upper Extremity is done.  Inspection:   Overall:  Elbow non-tender without crepitus or defects, forearm non-tender without crepitus or defects, wrist non-tender without crepitus or defects, hand non-tender.    Shoulder: with glenohumeral joint tenderness, without effusion.   Upper arm: without swelling and tenderness   Range of motion:   Overall:  Full range of motion of the elbow,  full range of motion of wrist and full range of motion in fingers.   Shoulder:  left  75 degrees forward flexion; 45 degrees abduction; 20 degrees internal rotation, 20 degrees external rotation, 5 degrees extension, 25 degrees adduction.   Stability:   Overall:  Shoulder, elbow and wrist stable   Strength and Tone:   Overall full shoulder muscles strength, full upper arm strength and normal upper arm bulk and tone.  X-rays of the right shoulder were done and reported separately.     Assessment & Plan:   Encounter Diagnosis  Name Primary?  . Right shoulder pain Yes   I will set him up for physical therapy/OT.  I have given Rx for Naprosyn and hydrocodone 7.5.  He is to use sling, ice.  Call if any problem.  Precautions discussed.  Return in three weeks.  His insurance requires a six week wait until MRI.  If not improved, consider MRI.  I am concerned about a rotator cuff injury.

## 2015-11-26 NOTE — Patient Instructions (Signed)
Begin PT. 

## 2015-12-02 ENCOUNTER — Telehealth: Payer: Self-pay | Admitting: Orthopaedic Surgery

## 2015-12-02 NOTE — Telephone Encounter (Signed)
Eugene Lin called to say that he spoke with his insurance company ([employer]) and was told that there is no waiting period for a MRI or anything else for him. If you need to verify he gave this number to call: [phone] and speak with a representative.

## 2015-12-03 NOTE — Telephone Encounter (Signed)
See message about MRI and no waiting for him.  Please call him.

## 2015-12-03 NOTE — Telephone Encounter (Signed)
Would you like [place] to proceed with scheduling MRI or wait until follow up appt?

## 2015-12-05 NOTE — Telephone Encounter (Signed)
Left message for patient to return my call.

## 2015-12-12 ENCOUNTER — Encounter: Payer: Self-pay | Admitting: Orthopaedic Surgery

## 2015-12-12 ENCOUNTER — Ambulatory Visit (INDEPENDENT_AMBULATORY_CARE_PROVIDER_SITE_OTHER): Payer: BLUE CROSS/BLUE SHIELD | Admitting: Orthopaedic Surgery

## 2015-12-12 VITALS — BP 131/75 | HR 79 | Temp 97.5°F | Ht 65.0 in | Wt 145.2 lb

## 2015-12-12 DIAGNOSIS — M25511 Pain in right shoulder: Secondary | ICD-10-CM | POA: Diagnosis not present

## 2015-12-12 MED ORDER — OXYCODONE-ACETAMINOPHEN 5-325 MG PO TABS: ORAL_TABLET | ORAL | Status: DC

## 2015-12-12 NOTE — Progress Notes (Signed)
Patient [id number] A Josie Dixon, male DOB:[birth year], 45 y.o. [id number]  Chief Complaint  Patient presents with  . Follow-up    Right shoulder    HPI  VAN GAMARRA is a 45 y.o. male who has had right shoulder pain and decreased motion now for about six weeks or longer.  He is getting no better.  He has no numbness.  He has no new trauma.  I will order a MRI.   HPI  Body mass index is 24.16 kg/(m^2).  Review of Systems  HENT: Negative for congestion.   Respiratory: Negative for cough and shortness of breath.   Cardiovascular: Negative for chest pain and leg swelling.  Endocrine: Positive for cold intolerance.  Musculoskeletal: Positive for myalgias and arthralgias.  Allergic/Immunologic: Positive for environmental allergies.  Neurological: Negative for numbness.    Past Medical History  Diagnosis Date  . Testicular cancer ([place])   . Back injury   . Chronic back pain     Past Surgical History  Procedure Laterality Date  . Testicle removal      History reviewed. No pertinent family history.  Social History Social History  Substance Use Topics  . Smoking status: Current Every Day Smoker -- 0.50 packs/day    Types: Cigarettes  . Smokeless tobacco: None  . Alcohol Use: Yes     Comment: beers occasionally    No Known Allergies  Current Outpatient Prescriptions  Medication Sig Dispense Refill  . cyclobenzaprine (FLEXERIL) 5 MG tablet Take 1 tablet (5 mg total) by mouth 3 (three) times daily as needed for muscle spasms. (Patient not taking: Reported on 11/26/2015) 30 tablet 0  . ibuprofen (ADVIL,MOTRIN) 800 MG tablet Take 1 tablet (800 mg total) by mouth 3 (three) times daily. (Patient not taking: Reported on 11/26/2015) 21 tablet 0  . naproxen (NAPROSYN) 500 MG tablet Take 1 tablet (500 mg total) by mouth 2 (two) times daily with a meal. 60 tablet 5  . oxyCODONE-acetaminophen (PERCOCET/ROXICET) 5-325 MG tablet One tablet every four hours as needed for pain.   Hydrocodone has been discontinued. 60 tablet 0  . predniSONE (DELTASONE) 10 MG tablet 6, 5, 4, 3, 2 then 1 tablet by mouth daily for 6 days total. (Patient not taking: Reported on 11/26/2015) 21 tablet 0   No current facility-administered medications for this visit.     Physical Exam  Blood pressure 131/75, pulse 79, temperature 97.5 F (36.4 C), height 5\' 5"  (1.651 m), weight 145 lb 3.2 oz (65.862 kg).  Constitutional: overall normal hygiene, normal nutrition, well developed, normal grooming, normal body habitus. Assistive device:none  Musculoskeletal: gait and station Limp none, muscle tone and strength are normal, no tremors or atrophy is present.  .  Neurological: coordination overall normal.  Deep tendon reflex/nerve stretch intact.  Sensation normal.  Cranial nerves II-XII intact.   Skin:   normal overall no scars, lesions, ulcers or rashes. No psoriasis.  Psychiatric: Alert and oriented x 3.  Recent memory intact, remote memory unclear.  Normal mood and affect. Well groomed.  Good eye contact.  Cardiovascular: overall no swelling, no varicosities, no edema bilaterally, normal temperatures of the legs and arms, no clubbing, cyanosis and good capillary refill.  Lymphatic: palpation is normal. Examination of right Upper Extremity is done.  Inspection:   Overall:  Elbow non-tender without crepitus or defects, forearm non-tender without crepitus or defects, wrist non-tender without crepitus or defects, hand non-tender.    Shoulder: with glenohumeral joint tenderness, without effusion.   Upper arm: without  swelling and tenderness   Range of motion:   Overall:  Full range of motion of the elbow, full range of motion of wrist and full range of motion in fingers.   Shoulder:  right  120 degrees forward flexion; 75 degrees abduction; 30 degrees internal rotation, 30 degrees external rotation, 10 degrees extension, 40 degrees adduction.   Stability:   Overall:  Shoulder, elbow and  wrist stable   Strength and Tone:   Overall full shoulder muscles strength, full upper arm strength and normal upper arm bulk and tone.  The patient has been educated about the nature of the problem(s) and counseled on treatment options.  The patient appeared to understand what I have discussed and is in agreement with it.  Encounter Diagnosis  Name Primary?  . Right shoulder pain Yes    PLAN Call if any problems.  Precautions discussed.  Continue current medications.   Return to clinic after getting the MRI of the right shoulder.

## 2015-12-14 ENCOUNTER — Other Ambulatory Visit: Payer: Self-pay

## 2015-12-14 ENCOUNTER — Ambulatory Visit
Admission: RE | Admit: 2015-12-14 | Discharge: 2015-12-14 | Disposition: A | Discharge status: Home / Self Care | Payer: BLUE CROSS/BLUE SHIELD | Source: Ambulatory Visit | Attending: Orthopaedic Surgery | Admitting: Orthopaedic Surgery

## 2015-12-14 DIAGNOSIS — M25511 Pain in right shoulder: Secondary | ICD-10-CM

## 2015-12-18 ENCOUNTER — Encounter: Payer: Self-pay | Admitting: Orthopaedic Surgery

## 2015-12-18 ENCOUNTER — Ambulatory Visit (INDEPENDENT_AMBULATORY_CARE_PROVIDER_SITE_OTHER): Payer: BLUE CROSS/BLUE SHIELD | Admitting: Orthopaedic Surgery

## 2015-12-18 VITALS — BP 139/82 | HR 87 | Temp 97.7°F | Ht 65.0 in | Wt 145.2 lb

## 2015-12-18 DIAGNOSIS — M75101 Unspecified rotator cuff tear or rupture of right shoulder, not specified as traumatic: Secondary | ICD-10-CM | POA: Diagnosis not present

## 2015-12-18 NOTE — Progress Notes (Signed)
Patient [id number] [name], male DOB:October 15, 1970, 45 y.o. [id number]  Chief Complaint  Patient presents with  . Results    MRI RESULTS RIGHT SHOULDER    HPI  Eugene Lin is a 45 y.o. male who is seen in follow-up for right shoulder pain.  He had the MRI of the right shoulder done and it shows:  IMPRESSION: 1. Rotator cuff tear involving the anterior aspect of the supraspinatus tendon and the upper fibers of the subscapularis tendon as described above. 2. Intact long head biceps tendon and glenoid labrum. 3. No significant findings for bony impingement. 4. Minimal/early glenohumeral joint degenerative changes, small joint effusion and mild synovitis.  I have explained to him the findings.  He wants surgery of the shoulder done. I have talked to him about physical therapy but he would like to pursue surgery option.  I will have him see Dr. Aline Brochure tomorrow afternoon. HPI  Body mass index is 24.16 kg/(m^2).   Review of Systems  HENT: Negative for congestion.   Respiratory: Negative for cough and shortness of breath.   Cardiovascular: Negative for chest pain and leg swelling.  Endocrine: Positive for cold intolerance.  Musculoskeletal: Positive for myalgias and arthralgias.  Allergic/Immunologic: Positive for environmental allergies.  Neurological: Negative for numbness.    Past Medical History  Diagnosis Date  . Testicular cancer ([place])   . Back injury   . Chronic back pain     Past Surgical History  Procedure Laterality Date  . Testicle removal      History reviewed. No pertinent family history.  Social History Social History  Substance Use Topics  . Smoking status: Current Every Day Smoker -- 0.50 packs/day    Types: Cigarettes  . Smokeless tobacco: None  . Alcohol Use: Yes     Comment: beers occasionally    No Known Allergies  Current Outpatient Prescriptions  Medication Sig Dispense Refill  . cyclobenzaprine (FLEXERIL) 5 MG tablet Take 1 tablet  (5 mg total) by mouth 3 (three) times daily as needed for muscle spasms. 30 tablet 0  . ibuprofen (ADVIL,MOTRIN) 800 MG tablet Take 1 tablet (800 mg total) by mouth 3 (three) times daily. 21 tablet 0  . naproxen (NAPROSYN) 500 MG tablet Take 1 tablet (500 mg total) by mouth 2 (two) times daily with a meal. 60 tablet 5  . oxyCODONE-acetaminophen (PERCOCET/ROXICET) 5-325 MG tablet One tablet every four hours as needed for pain.  Hydrocodone has been discontinued. 60 tablet 0  . predniSONE (DELTASONE) 10 MG tablet 6, 5, 4, 3, 2 then 1 tablet by mouth daily for 6 days total. 21 tablet 0   No current facility-administered medications for this visit.     Physical Exam  Blood pressure 139/82, pulse 87, temperature 97.7 F (36.5 C), height 5\' 5"  (1.651 m), weight 145 lb 3.2 oz (65.862 kg).  Constitutional: overall normal hygiene, normal nutrition, well developed, normal grooming, normal body habitus. Assistive device:none  Musculoskeletal: gait and station Limp none, muscle tone and strength are normal, no tremors or atrophy is present.  .  Neurological: coordination overall normal.  Deep tendon reflex/nerve stretch intact.  Sensation normal.  Cranial nerves II-XII intact.   Skin:   normal overall no scars, lesions, ulcers or rashes. No psoriasis.  Psychiatric: Alert and oriented x 3.  Recent memory intact, remote memory unclear.  Normal mood and affect. Well groomed.  Good eye contact.  Cardiovascular: overall no swelling, no varicosities, no edema bilaterally, normal temperatures of the legs and  arms, no clubbing, cyanosis and good capillary refill.  Lymphatic: palpation is normal.  Examination of right Upper Extremity is done.  Inspection:   Overall:  Elbow non-tender without crepitus or defects, forearm non-tender without crepitus or defects, wrist non-tender without crepitus or defects, hand non-tender.    Shoulder: with glenohumeral joint tenderness, without effusion.   Upper arm:  without swelling and tenderness   Range of motion:   Overall:  Full range of motion of the elbow, full range of motion of wrist and full range of motion in fingers.   Shoulder:  right  100 degrees forward flexion; 75 degrees abduction; 30 degrees internal rotation, 30 degrees external rotation, 10 degrees extension, 40 degrees adduction.   Stability:   Overall:  Shoulder, elbow and wrist stable   Strength and Tone:   Overall full shoulder muscles strength, full upper arm strength and normal upper arm bulk and tone.  Left shoulder normal.  The patient has been educated about the nature of the problem(s) and counseled on treatment options.  The patient appeared to understand what I have discussed and is in agreement with it.  Encounter Diagnosis  Name Primary?  . Rotator cuff tear, right Yes    PLAN Call if any problems.  Precautions discussed.  Continue current medications.   Return to clinic See Dr. Aline Brochure tomorrow.

## 2015-12-18 NOTE — Patient Instructions (Signed)
appt has been scheduled to see Dr Aline Brochure tomorrow

## 2015-12-19 ENCOUNTER — Ambulatory Visit (INDEPENDENT_AMBULATORY_CARE_PROVIDER_SITE_OTHER): Payer: BLUE CROSS/BLUE SHIELD | Admitting: Orthopedic Surgery

## 2015-12-19 ENCOUNTER — Encounter: Payer: Self-pay | Admitting: Orthopedic Surgery

## 2015-12-19 VITALS — BP 122/79 | HR 85 | Ht 65.0 in | Wt 145.0 lb

## 2015-12-19 DIAGNOSIS — M75101 Unspecified rotator cuff tear or rupture of right shoulder, not specified as traumatic: Secondary | ICD-10-CM | POA: Diagnosis not present

## 2015-12-19 NOTE — Patient Instructions (Signed)
SURGERY 12/25/15 RIGHT SHOULDER ARTHROSCOPY AND ROTATOR CUFF REPAIR

## 2015-12-19 NOTE — Progress Notes (Signed)
Chief Complaint  Patient presents with  . Advice Only    Discuss surgery for right rotator cuff tear.    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 Luna Glasgow for evaluation of a right rotator cuff tear involving the supraspinatus tendon as well as the subscapularis.  He has significant range of motion loss severe pain involving the right shoulder associated with weakness.  Review of Systems  Constitutional: Negative for fever and chills.  Respiratory: Negative for shortness of breath and wheezing.   Cardiovascular: Negative for chest pain.  Neurological: Negative for tingling.    Past Medical History  Diagnosis Date  . Testicular cancer ([place])   . Back injury   . Chronic back pain     Past Surgical History  Procedure Laterality Date  . Testicle removal     No family history on file. Social History  Substance Use Topics  . Smoking status: Current Every Day Smoker -- 0.50 packs/day    Types: Cigarettes  . Smokeless tobacco: None  . Alcohol Use: Yes     Comment: beers occasionally    Current outpatient prescriptions:  .  cyclobenzaprine (FLEXERIL) 5 MG tablet, Take 1 tablet (5 mg total) by mouth 3 (three) times daily as needed for muscle spasms., Disp: 30 tablet, Rfl: 0 .  ibuprofen (ADVIL,MOTRIN) 800 MG tablet, Take 1 tablet (800 mg total) by mouth 3 (three) times daily., Disp: 21 tablet, Rfl: 0 .  naproxen (NAPROSYN) 500 MG tablet, Take 1 tablet (500 mg total) by mouth 2 (two) times daily with a meal., Disp: 60 tablet, Rfl: 5 .  oxyCODONE-acetaminophen (PERCOCET/ROXICET) 5-325 MG tablet, One tablet every four hours as needed for pain.  Hydrocodone has been discontinued., Disp: 60 tablet, Rfl: 0 .  predniSONE (DELTASONE) 10 MG tablet, 6, 5, 4, 3, 2 then 1 tablet by mouth daily for 6 days total., Disp: 21 tablet, Rfl: 0  BP 122/79 mmHg  Pulse 85  Ht 5\' 5"  (1.651 m)   Wt 145 lb (65.772 kg)  BMI 24.13 kg/m2  Physical Exam  Constitutional: He is oriented to person, place, and time. He appears well-developed and well-nourished. No distress.  Cardiovascular: Normal rate and intact distal pulses.   Neurological: He is alert and oriented to person, place, and time.  Skin: Skin is warm and dry. No rash noted. He is not diaphoretic. No erythema. No pallor.  Psychiatric: He has a normal mood and affect. His behavior is normal. Judgment and thought content normal.    Ortho Exam  Right shoulder he severely tender over the right shoulder indicating he may have some inflammatory response to this injury going on is also lost external rotation and abduction is limited to 40.  His left shoulder is normal to inspection full range of motion stability and strength normal skin intact pulses intact lymph nodes negative sensation normal  The right shoulder is stable as much as we can tell the skin is normal the pulses are normal lymph nodes are negative in the sensation is normal   ASSESSMENT: My personal interpretation of the images:  The MRI does show rotator cuff tear subscapularis tear  We will probably have to do arthroscopic surgery to evaluate the glenohumeral joint for arthritis and labral degeneration and then to a acromial coracoid incision to get to the subscapularis  I have counseled the patient regarding his difficult postoperative course.  He is already on Percocet for pain and is taking 2 tablets to get relief he is very stiff that will have to be overcome as well. Advised him that he needs to be out of work for 2-3 months      PLAN Arthroscopy right shoulder plus rotator cuff repair right shoulder

## 2015-12-20 NOTE — Patient Instructions (Addendum)
Eugene Lin  12/20/2015     @PREFPERIOPPHARMACY @   Your procedure is scheduled on 12/25/2015.  Report to [name] at 6:15 A.M.  Call this number if you have problems the morning of surgery:  [phone]   Remember:  Do not eat food or drink liquids after midnight.  Take these medicines the morning of surgery with A SIP OF WATER : FLEXERIL AND PERCECET   Do not wear jewelry, make-up or nail polish.  Do not wear lotions, powders, or perfumes.  You may wear deodorant.  Do not shave 48 hours prior to surgery.  Men may shave face and neck.  Do not bring valuables to the hospital.  [facility] is not responsible for any belongings or valuables.  Contacts, dentures or bridgework may not be worn into surgery.  Leave your suitcase in the car.  After surgery it may be brought to your room.  For patients admitted to the hospital, discharge time will be determined by your treatment team.  Patients discharged the day of surgery will not be allowed to drive home.   Name and phone number of your driver:   FAMILY Special instructions:  N/A  Please read over the following fact sheets that you were given. Care and Recovery After Surgery       Shoulder Arthroscopy, Care After Refer to this sheet in the next few weeks. These instructions provide you with information on caring for yourself after your procedure. Your health care provider may also give you more specific instructions. Your treatment has been planned according to current medical practices, but problems sometimes occur. Call your health care provider if you have any problems or questions after your procedure.  WHAT TO EXPECT AFTER THE PROCEDURE After your procedure, it is typical to have the following:  Pain at the site of the surgical cuts (incisions).  Stiffness in your shoulder. This should gradually decrease over time. Your health care provider may recommend physical therapy to help improve this.  Nausea, vomiting,  or constipation. These symptoms can result from taking pain medicine after surgery.  Clear or red drainage from the incision sites. This is normal for a few days after surgery.  Fatigue. HOME CARE INSTRUCTIONS  Take medicines only as directed by your health care provider.  Use a sling as directed.  There are many different ways to close and cover an incision, including stitches, skin glue, and adhesive strips. Follow your health care provider's instructions on:  Incision care.  Bandage (dressing) changes and removal.  Incision closure removal.  Apply ice to the injured area:  Put ice in a plastic bag.  Place a towel between your skin and the bag.  Leave the ice on for 20 minutes, 2-3 times a day.  If physical therapy and exercises are prescribed by your health care provider, do them as directed.  Keep all follow-up visits as directed by your health care provider. This is important. SEEK MEDICAL CARE IF: You have a fever. SEEK IMMEDIATE MEDICAL CARE IF:  You have drainage, redness, swelling, or increasing pain at the incision site.  You notice a bad smell coming from the incision site or dressing.  Your incision site breaks open after your stitches or tape has been removed.   This information is not intended to replace advice given to you by your health care provider. Make sure you discuss any questions you have with your health care provider.   Document Released: 02/21/2014 Document Reviewed: 02/21/2014 Elsevier Interactive  Patient Education 2016 [employer].

## 2015-12-23 ENCOUNTER — Encounter (HOSPITAL_COMMUNITY)
Admission: RE | Admit: 2015-12-23 | Discharge: 2015-12-23 | Disposition: A | Discharge status: Home / Self Care | Payer: BLUE CROSS/BLUE SHIELD | Source: Ambulatory Visit | Attending: Orthopedic Surgery | Admitting: Orthopedic Surgery

## 2015-12-23 ENCOUNTER — Encounter (HOSPITAL_COMMUNITY): Payer: Self-pay

## 2015-12-23 ENCOUNTER — Other Ambulatory Visit: Payer: Self-pay

## 2015-12-23 DIAGNOSIS — G8929 Other chronic pain: Secondary | ICD-10-CM | POA: Diagnosis not present

## 2015-12-23 DIAGNOSIS — W19XXXA Unspecified fall, initial encounter: Secondary | ICD-10-CM | POA: Diagnosis not present

## 2015-12-23 DIAGNOSIS — S46011A Strain of muscle(s) and tendon(s) of the rotator cuff of right shoulder, initial encounter: Secondary | ICD-10-CM | POA: Diagnosis not present

## 2015-12-23 DIAGNOSIS — F419 Anxiety disorder, unspecified: Secondary | ICD-10-CM | POA: Diagnosis not present

## 2015-12-23 DIAGNOSIS — Z8547 Personal history of malignant neoplasm of testis: Secondary | ICD-10-CM | POA: Diagnosis not present

## 2015-12-23 DIAGNOSIS — Z01812 Encounter for preprocedural laboratory examination: Secondary | ICD-10-CM | POA: Diagnosis not present

## 2015-12-23 DIAGNOSIS — Z791 Long term (current) use of non-steroidal anti-inflammatories (NSAID): Secondary | ICD-10-CM | POA: Diagnosis not present

## 2015-12-23 DIAGNOSIS — F17210 Nicotine dependence, cigarettes, uncomplicated: Secondary | ICD-10-CM | POA: Diagnosis not present

## 2015-12-23 DIAGNOSIS — Z01810 Encounter for preprocedural cardiovascular examination: Secondary | ICD-10-CM | POA: Diagnosis not present

## 2015-12-23 HISTORY — DX: Unspecified osteoarthritis, unspecified site: M19.90

## 2015-12-23 LAB — CBC WITH DIFFERENTIAL/PLATELET
BASOS ABS: 0 10*3/uL (ref 0.0–0.1)
BASOS PCT: 0 %
EOS ABS: 0.2 10*3/uL (ref 0.0–0.7)
EOS PCT: 2 %
HCT: 39 % (ref 39.0–52.0)
Hemoglobin: 13.6 g/dL (ref 13.0–17.0)
LYMPHS ABS: 1.3 10*3/uL (ref 0.7–4.0)
Lymphocytes Relative: 18 %
MCH: 33.3 pg (ref 26.0–34.0)
MCHC: 34.9 g/dL (ref 30.0–36.0)
MCV: 95.6 fL (ref 78.0–100.0)
Monocytes Absolute: 0.4 10*3/uL (ref 0.1–1.0)
Monocytes Relative: 6 %
Neutro Abs: 5.4 10*3/uL (ref 1.7–7.7)
Neutrophils Relative %: 74 %
PLATELETS: 184 10*3/uL (ref 150–400)
RBC: 4.08 MIL/uL — AB (ref 4.22–5.81)
RDW: 12.8 % (ref 11.5–15.5)
WBC: 7.4 10*3/uL (ref 4.0–10.5)

## 2015-12-23 LAB — BASIC METABOLIC PANEL
Anion gap: 2 — ABNORMAL LOW (ref 5–15)
BUN: 13 mg/dL (ref 6–20)
CO2: 28 mmol/L (ref 22–32)
CREATININE: 0.97 mg/dL (ref 0.61–1.24)
Calcium: 8.8 mg/dL — ABNORMAL LOW (ref 8.9–10.3)
Chloride: 105 mmol/L (ref 101–111)
GFR calc Af Amer: >60 mL/min (ref >60)
Glucose, Bld: 84 mg/dL (ref 65–99)
POTASSIUM: 4.3 mmol/L (ref 3.5–5.1)
SODIUM: 135 mmol/L (ref 135–145)

## 2015-12-23 NOTE — Pre-Procedure Instructions (Signed)
Patient given information to sign up for my chart at home. 

## 2015-12-25 ENCOUNTER — Ambulatory Visit (HOSPITAL_COMMUNITY)
Admission: RE | Admit: 2015-12-25 | Discharge: 2015-12-25 | Disposition: A | Discharge status: Home / Self Care | Payer: BLUE CROSS/BLUE SHIELD | Source: Ambulatory Visit | Attending: Orthopedic Surgery | Admitting: Orthopedic Surgery

## 2015-12-25 ENCOUNTER — Ambulatory Visit (HOSPITAL_COMMUNITY): Payer: BLUE CROSS/BLUE SHIELD | Admitting: Anesthesiology

## 2015-12-25 ENCOUNTER — Encounter (HOSPITAL_COMMUNITY)
Admission: RE | Disposition: A | Discharge status: Home / Self Care | Payer: Self-pay | Source: Ambulatory Visit | Attending: Orthopedic Surgery

## 2015-12-25 DIAGNOSIS — Z01812 Encounter for preprocedural laboratory examination: Secondary | ICD-10-CM | POA: Insufficient documentation

## 2015-12-25 DIAGNOSIS — Z01810 Encounter for preprocedural cardiovascular examination: Secondary | ICD-10-CM | POA: Insufficient documentation

## 2015-12-25 DIAGNOSIS — Z8547 Personal history of malignant neoplasm of testis: Secondary | ICD-10-CM | POA: Insufficient documentation

## 2015-12-25 DIAGNOSIS — M75101 Unspecified rotator cuff tear or rupture of right shoulder, not specified as traumatic: Secondary | ICD-10-CM

## 2015-12-25 DIAGNOSIS — M7551 Bursitis of right shoulder: Secondary | ICD-10-CM | POA: Diagnosis not present

## 2015-12-25 DIAGNOSIS — M75100 Unspecified rotator cuff tear or rupture of unspecified shoulder, not specified as traumatic: Secondary | ICD-10-CM | POA: Insufficient documentation

## 2015-12-25 DIAGNOSIS — F419 Anxiety disorder, unspecified: Secondary | ICD-10-CM | POA: Insufficient documentation

## 2015-12-25 DIAGNOSIS — M7550 Bursitis of unspecified shoulder: Secondary | ICD-10-CM | POA: Insufficient documentation

## 2015-12-25 DIAGNOSIS — F17210 Nicotine dependence, cigarettes, uncomplicated: Secondary | ICD-10-CM | POA: Insufficient documentation

## 2015-12-25 DIAGNOSIS — S46011A Strain of muscle(s) and tendon(s) of the rotator cuff of right shoulder, initial encounter: Secondary | ICD-10-CM | POA: Diagnosis not present

## 2015-12-25 DIAGNOSIS — G8929 Other chronic pain: Secondary | ICD-10-CM | POA: Insufficient documentation

## 2015-12-25 DIAGNOSIS — Z791 Long term (current) use of non-steroidal anti-inflammatories (NSAID): Secondary | ICD-10-CM | POA: Insufficient documentation

## 2015-12-25 DIAGNOSIS — Z189 Retained foreign body fragments, unspecified material: Secondary | ICD-10-CM

## 2015-12-25 DIAGNOSIS — W19XXXA Unspecified fall, initial encounter: Secondary | ICD-10-CM | POA: Insufficient documentation

## 2015-12-25 HISTORY — PX: SHOULDER ARTHROSCOPY WITH ROTATOR CUFF REPAIR: SHX5685

## 2015-12-25 HISTORY — PX: SHOULDER ACROMIOPLASTY: SHX6093

## 2015-12-25 SURGERY — ARTHROSCOPY, SHOULDER, WITH ROTATOR CUFF REPAIR
Anesthesia: General | Site: Shoulder | Laterality: Right

## 2015-12-25 MED ORDER — GABAPENTIN 100 MG PO CAPS: 100.0000 mg | ORAL_CAPSULE | Freq: Three times a day (TID) | ORAL | Status: AC

## 2015-12-25 MED ORDER — NEOSTIGMINE METHYLSULFATE 10 MG/10ML IV SOLN
INTRAVENOUS | Status: DC | PRN
  Administered 2015-12-25: 4 mg via INTRAVENOUS

## 2015-12-25 MED ORDER — FENTANYL CITRATE (PF) 100 MCG/2ML IJ SOLN
25.0000 ug | INTRAMUSCULAR | Status: AC
  Administered 2015-12-25 (×2): 25 ug via INTRAVENOUS

## 2015-12-25 MED ORDER — ROCURONIUM BROMIDE 100 MG/10ML IV SOLN
INTRAVENOUS | Status: DC | PRN
  Administered 2015-12-25: 45 mg via INTRAVENOUS
  Administered 2015-12-25: 10 mg via INTRAVENOUS
  Administered 2015-12-25: 5 mg via INTRAVENOUS

## 2015-12-25 MED ORDER — PREGABALIN 50 MG PO CAPS
50.0000 mg | ORAL_CAPSULE | Freq: Once | ORAL | Status: AC
  Administered 2015-12-25: 50 mg via ORAL
  Filled 2015-12-25: qty 1

## 2015-12-25 MED ORDER — SODIUM CHLORIDE 0.9 % IJ SOLN
INTRAMUSCULAR | Status: AC
  Filled 2015-12-25: qty 10

## 2015-12-25 MED ORDER — FENTANYL CITRATE (PF) 250 MCG/5ML IJ SOLN
INTRAMUSCULAR | Status: AC
  Filled 2015-12-25: qty 5

## 2015-12-25 MED ORDER — SUFENTANIL CITRATE 50 MCG/ML IV SOLN
INTRAVENOUS | Status: AC
  Filled 2015-12-25: qty 1

## 2015-12-25 MED ORDER — METHOCARBAMOL 500 MG PO TABS: 500.0000 mg | ORAL_TABLET | Freq: Four times a day (QID) | ORAL | Status: AC | PRN

## 2015-12-25 MED ORDER — CEFAZOLIN SODIUM-DEXTROSE 2-4 GM/100ML-% IV SOLN
2.0000 g | INTRAVENOUS | Status: AC
  Administered 2015-12-25: 2 g via INTRAVENOUS
  Filled 2015-12-25: qty 100

## 2015-12-25 MED ORDER — MIDAZOLAM HCL 2 MG/2ML IJ SOLN
INTRAMUSCULAR | Status: DC | PRN
  Administered 2015-12-25: 2 mg via INTRAVENOUS

## 2015-12-25 MED ORDER — METHOCARBAMOL 1000 MG/10ML IJ SOLN
500.0000 mg | Freq: Once | INTRAVENOUS | Status: AC
  Administered 2015-12-25: 500 mg via INTRAVENOUS
  Filled 2015-12-25: qty 5

## 2015-12-25 MED ORDER — ROCURONIUM BROMIDE 50 MG/5ML IV SOLN
INTRAVENOUS | Status: AC
  Filled 2015-12-25: qty 1

## 2015-12-25 MED ORDER — PROPOFOL 10 MG/ML IV BOLUS
INTRAVENOUS | Status: DC | PRN
  Administered 2015-12-25 (×2): 50 mg via INTRAVENOUS
  Administered 2015-12-25: 150 mg via INTRAVENOUS
  Administered 2015-12-25: 50 mg via INTRAVENOUS

## 2015-12-25 MED ORDER — ONDANSETRON HCL 4 MG/2ML IJ SOLN
4.0000 mg | Freq: Once | INTRAMUSCULAR | Status: AC
  Administered 2015-12-25: 4 mg via INTRAVENOUS

## 2015-12-25 MED ORDER — CELECOXIB 400 MG PO CAPS
400.0000 mg | ORAL_CAPSULE | Freq: Once | ORAL | Status: AC
  Administered 2015-12-25: 400 mg via ORAL
  Filled 2015-12-25: qty 1

## 2015-12-25 MED ORDER — MIDAZOLAM HCL 2 MG/2ML IJ SOLN
1.0000 mg | INTRAMUSCULAR | Status: DC | PRN
  Administered 2015-12-25 (×2): 2 mg via INTRAVENOUS
  Filled 2015-12-25: qty 2

## 2015-12-25 MED ORDER — ONDANSETRON HCL 4 MG/2ML IJ SOLN
4.0000 mg | Freq: Once | INTRAMUSCULAR | Status: DC | PRN
  Filled 2015-12-25: qty 2

## 2015-12-25 MED ORDER — DEXAMETHASONE SODIUM PHOSPHATE 4 MG/ML IJ SOLN
INTRAMUSCULAR | Status: AC
  Filled 2015-12-25: qty 1

## 2015-12-25 MED ORDER — FENTANYL CITRATE (PF) 100 MCG/2ML IJ SOLN
INTRAMUSCULAR | Status: DC | PRN
  Administered 2015-12-25 (×2): 50 ug via INTRAVENOUS
  Administered 2015-12-25: 100 ug via INTRAVENOUS
  Administered 2015-12-25: 50 ug via INTRAVENOUS

## 2015-12-25 MED ORDER — BUPIVACAINE-EPINEPHRINE (PF) 0.5% -1:200000 IJ SOLN
INTRAMUSCULAR | Status: AC
  Filled 2015-12-25: qty 60

## 2015-12-25 MED ORDER — OXYCODONE-ACETAMINOPHEN 10-325 MG PO TABS: 1.0000 | ORAL_TABLET | ORAL | Status: DC | PRN

## 2015-12-25 MED ORDER — GLYCOPYRROLATE 0.2 MG/ML IJ SOLN
INTRAMUSCULAR | Status: DC | PRN
  Administered 2015-12-25: .8 mg via INTRAVENOUS

## 2015-12-25 MED ORDER — CEFAZOLIN SODIUM-DEXTROSE 2-4 GM/100ML-% IV SOLN
INTRAVENOUS | Status: AC
  Filled 2015-12-25: qty 100

## 2015-12-25 MED ORDER — BUPIVACAINE-EPINEPHRINE 0.5% -1:200000 IJ SOLN
INTRAMUSCULAR | Status: DC | PRN
  Administered 2015-12-25: 60 mL

## 2015-12-25 MED ORDER — FENTANYL CITRATE (PF) 100 MCG/2ML IJ SOLN
25.0000 ug | INTRAMUSCULAR | Status: DC | PRN
  Administered 2015-12-25: 50 ug via INTRAVENOUS
  Filled 2015-12-25: qty 2

## 2015-12-25 MED ORDER — GLYCOPYRROLATE 0.2 MG/ML IJ SOLN
INTRAMUSCULAR | Status: AC
  Filled 2015-12-25: qty 4

## 2015-12-25 MED ORDER — ONDANSETRON HCL 4 MG/2ML IJ SOLN
INTRAMUSCULAR | Status: AC
  Filled 2015-12-25: qty 2

## 2015-12-25 MED ORDER — SUFENTANIL CITRATE 50 MCG/ML IV SOLN
INTRAVENOUS | Status: DC | PRN
  Administered 2015-12-25: 20 ug via INTRAVENOUS
  Administered 2015-12-25 (×2): 10 ug via INTRAVENOUS
  Administered 2015-12-25: 20 ug via INTRAVENOUS

## 2015-12-25 MED ORDER — MIDAZOLAM HCL 2 MG/2ML IJ SOLN
INTRAMUSCULAR | Status: AC
  Filled 2015-12-25: qty 2

## 2015-12-25 MED ORDER — PROPOFOL 10 MG/ML IV BOLUS
INTRAVENOUS | Status: AC
  Filled 2015-12-25: qty 20

## 2015-12-25 MED ORDER — NEOSTIGMINE METHYLSULFATE 10 MG/10ML IV SOLN
INTRAVENOUS | Status: AC
  Filled 2015-12-25: qty 1

## 2015-12-25 MED ORDER — EPINEPHRINE HCL 1 MG/ML IJ SOLN
INTRAMUSCULAR | Status: AC
  Filled 2015-12-25: qty 10

## 2015-12-25 MED ORDER — LIDOCAINE HCL (CARDIAC) 10 MG/ML IV SOLN
INTRAVENOUS | Status: DC | PRN
  Administered 2015-12-25: 50 mg via INTRAVENOUS

## 2015-12-25 MED ORDER — HEMOSTATIC AGENTS (NO CHARGE) OPTIME
TOPICAL | Status: DC | PRN
  Administered 2015-12-25: 2 via TOPICAL

## 2015-12-25 MED ORDER — LIDOCAINE HCL (PF) 1 % IJ SOLN
INTRAMUSCULAR | Status: AC
  Filled 2015-12-25: qty 5

## 2015-12-25 MED ORDER — OXYCODONE HCL 5 MG PO TABS
10.0000 mg | ORAL_TABLET | Freq: Once | ORAL | Status: AC
  Administered 2015-12-25: 10 mg via ORAL
  Filled 2015-12-25: qty 2

## 2015-12-25 MED ORDER — IBUPROFEN 800 MG PO TABS: 800.0000 mg | ORAL_TABLET | Freq: Three times a day (TID) | ORAL | Status: AC | PRN

## 2015-12-25 MED ORDER — DEXAMETHASONE SODIUM PHOSPHATE 4 MG/ML IJ SOLN
4.0000 mg | Freq: Once | INTRAMUSCULAR | Status: AC
  Administered 2015-12-25: 4 mg via INTRAVENOUS

## 2015-12-25 MED ORDER — LACTATED RINGERS IV SOLN
INTRAVENOUS | Status: DC
  Administered 2015-12-25: 1000 mL via INTRAVENOUS

## 2015-12-25 MED ORDER — ONDANSETRON HCL 4 MG/2ML IJ SOLN
4.0000 mg | Freq: Once | INTRAMUSCULAR | Status: AC
  Administered 2015-12-25: 4 mg via INTRAVENOUS
  Filled 2015-12-25: qty 2

## 2015-12-25 MED ORDER — FENTANYL CITRATE (PF) 100 MCG/2ML IJ SOLN
INTRAMUSCULAR | Status: AC
  Filled 2015-12-25: qty 2

## 2015-12-25 MED ORDER — SODIUM CHLORIDE 0.9 % IR SOLN
Status: DC | PRN
  Administered 2015-12-25 (×3): 3000 mL

## 2015-12-25 MED ORDER — SODIUM CHLORIDE 0.9 % IR SOLN
Status: DC | PRN
Start: 2015-12-25 — End: 2015-12-25
  Administered 2015-12-25: 1000 mL

## 2015-12-25 SURGICAL SUPPLY — 95 items
ANCHOR SUT 5.5 SPEEDSCREW (Screw) ×2 IMPLANT
BAG HAMPER (MISCELLANEOUS) ×3 IMPLANT
BIT DRILL 2.4X128 (BIT) ×1 IMPLANT
BIT DRILL 2.4X128MM (BIT) ×1
BLADE AGGRESSIVE PLUS 4.0 (BLADE) ×3 IMPLANT
BLADE AVERAGE 25MMX9MM (BLADE)
BLADE AVERAGE 25X9 (BLADE) IMPLANT
BLADE HEX COATED 2.75 (ELECTRODE) ×5 IMPLANT
BLADE SURG 15 STRL LF DISP TIS (BLADE) ×1 IMPLANT
BLADE SURG 15 STRL SS (BLADE) ×3
BLADE SURG SZ10 CARB STEEL (BLADE) ×3 IMPLANT
BLADE SURG SZ11 CARB STEEL (BLADE) ×3 IMPLANT
BNDG COHESIVE 4X5 TAN STRL (GAUZE/BANDAGES/DRESSINGS) ×3 IMPLANT
BUR 5.0 BARRELL (BURR) IMPLANT
BUR BARRELL 4.0 (BURR) IMPLANT
BUR ROUND 5.0 (BURR) IMPLANT
CANNULA DRILOCK 5.0MMX75MM (CANNULA) ×1
CANNULA DRILOCK 5.0X75 (CANNULA) ×1 IMPLANT
CANNULA DRILOCK 6.5MMX75MM (CANNULA)
CANNULA DRILOCK 6.5X75 (CANNULA) ×1 IMPLANT
CANNULA DRILOCK 8.0MMX75MM (CANNULA)
CANNULA DRILOCK 8.0X75 (CANNULA) IMPLANT
CHLORAPREP W/TINT 26ML (MISCELLANEOUS) ×3 IMPLANT
CLOTH BEACON ORANGE TIMEOUT ST (SAFETY) ×3 IMPLANT
CONNECTOR PERFECT PASSER (CONNECTOR) ×2 IMPLANT
COVER LIGHT HANDLE STERIS (MISCELLANEOUS) ×6 IMPLANT
DECANTER SPIKE VIAL GLASS SM (MISCELLANEOUS) ×5 IMPLANT
DRAPE PROXIMA HALF (DRAPES) ×3 IMPLANT
DRAPE SHOULDER BEACH CHAIR (DRAPES) ×3 IMPLANT
DRAPE U-SHAPE 47X51 STRL (DRAPES) ×3 IMPLANT
DRESSING MEPILEX BORDER 6X8 (GAUZE/BANDAGES/DRESSINGS) IMPLANT
DRSG MEPILEX BORDER 4X8 (GAUZE/BANDAGES/DRESSINGS) IMPLANT
DRSG MEPILEX BORDER 6X8 (GAUZE/BANDAGES/DRESSINGS) ×3
ELECT REM PT RETURN 9FT ADLT (ELECTROSURGICAL) ×3
ELECTRODE REM PT RTRN 9FT ADLT (ELECTROSURGICAL) ×1 IMPLANT
FIBERSTICK 2 (SUTURE) IMPLANT
GAUZE SPONGE 4X4 16PLY XRAY LF (GAUZE/BANDAGES/DRESSINGS) ×3 IMPLANT
GLOVE BIOGEL M 7.0 STRL (GLOVE) ×4 IMPLANT
GLOVE BIOGEL PI IND STRL 7.0 (GLOVE) ×1 IMPLANT
GLOVE BIOGEL PI INDICATOR 7.0 (GLOVE) ×4
GLOVE SKINSENSE NS SZ6.5 (GLOVE) ×2
GLOVE SKINSENSE NS SZ8.0 LF (GLOVE) ×2
GLOVE SKINSENSE STRL SZ6.5 (GLOVE) IMPLANT
GLOVE SKINSENSE STRL SZ8.0 LF (GLOVE) ×1 IMPLANT
GLOVE SS N UNI LF 8.5 STRL (GLOVE) ×3 IMPLANT
GOWN STRL REUS W/TWL LRG LVL3 (GOWN DISPOSABLE) ×7 IMPLANT
GOWN STRL REUS W/TWL XL LVL3 (GOWN DISPOSABLE) ×3 IMPLANT
HEMOSTAT SURGICEL 4X8 (HEMOSTASIS) ×4 IMPLANT
IMMOBILIZER SHOULDER LGE (ORTHOPEDIC SUPPLIES) ×2 IMPLANT
IMMOBILIZER SHOULDER MED (ORTHOPEDIC SUPPLIES) IMPLANT
INST SET MINOR BONE (KITS) ×3 IMPLANT
IV NS IRRIG 3000ML ARTHROMATIC (IV SOLUTION) ×8 IMPLANT
KIT BLADEGUARD II DBL (SET/KITS/TRAYS/PACK) ×3 IMPLANT
KIT POSITION SHOULDER SCHLEI (MISCELLANEOUS) ×3 IMPLANT
KIT ROOM TURNOVER APOR (KITS) ×3 IMPLANT
MANIFOLD NEPTUNE II (INSTRUMENTS) ×3 IMPLANT
MARKER SKIN DUAL TIP RULER LAB (MISCELLANEOUS) ×3 IMPLANT
NDL HYPO 18GX1.5 BLUNT FILL (NEEDLE) ×1 IMPLANT
NDL HYPO 21X1.5 SAFETY (NEEDLE) ×1 IMPLANT
NDL MAYO 6 CRC TAPER PT (NEEDLE) IMPLANT
NDL SPNL 18GX3.5 QUINCKE PK (NEEDLE) ×1 IMPLANT
NEEDLE HYPO 18GX1.5 BLUNT FILL (NEEDLE) ×3 IMPLANT
NEEDLE HYPO 21X1.5 SAFETY (NEEDLE) ×3 IMPLANT
NEEDLE MAYO 6 CRC TAPER PT (NEEDLE) ×3 IMPLANT
NEEDLE SPNL 18GX3.5 QUINCKE PK (NEEDLE) ×3 IMPLANT
NS IRRIG 1000ML POUR BTL (IV SOLUTION) ×3 IMPLANT
PACK BASIC III (CUSTOM PROCEDURE TRAY) ×3
PACK SRG BSC III STRL LF ECLPS (CUSTOM PROCEDURE TRAY) ×1 IMPLANT
PAD ABD 5X9 TENDERSORB (GAUZE/BANDAGES/DRESSINGS) IMPLANT
PAD ARMBOARD 7.5X6 YLW CONV (MISCELLANEOUS) ×3 IMPLANT
PASSER SUT SWANSON 36MM LOOP (INSTRUMENTS) ×1 IMPLANT
PENCIL HANDSWITCHING (ELECTRODE) ×3 IMPLANT
RASP SM TEAR CROSS CUT (RASP) ×2 IMPLANT
SET ARTHROSCOPY INST (INSTRUMENTS) ×3 IMPLANT
SET BASIN LINEN APH (SET/KITS/TRAYS/PACK) ×3 IMPLANT
SPONGE LAP 18X18 X RAY DECT (DISPOSABLE) ×2 IMPLANT
STAPLER VISISTAT 35W (STAPLE) ×2 IMPLANT
STOCKINETTE IMPERVIOUS LG (DRAPES) ×3 IMPLANT
SUT ETHIBOND NAB OS 4 #2 30IN (SUTURE) ×2 IMPLANT
SUT ETHILON 3 0 FSL (SUTURE) ×2 IMPLANT
SUT MNCRL 0 VIOLET CTX 36 (SUTURE) ×1 IMPLANT
SUT MONOCRYL 0 CTX 36 (SUTURE) ×2
SUT PERFECT PASSER SMRTSTITCH (MISCELLANEOUS) ×2 IMPLANT
SUT PROLENE 2 0 FS (SUTURE) IMPLANT
SUT SMART STITCH CARTRIDGE (SUTURE) ×2 IMPLANT
SUT VIC AB 1 CT1 27 (SUTURE)
SUT VIC AB 1 CT1 27XBRD ANTBC (SUTURE) IMPLANT
SYR 30ML LL (SYRINGE) ×3 IMPLANT
SYR 5ML LL (SYRINGE) ×3 IMPLANT
SYR BULB IRRIGATION 50ML (SYRINGE) ×4 IMPLANT
TOWEL OR 17X26 4PK STRL BLUE (TOWEL DISPOSABLE) ×1 IMPLANT
TUBING ARTHROSCOPY INFLOW/OUT (IRRIGATION / IRRIGATOR) ×3 IMPLANT
WAND 90 DEG TURBOVAC W/CORD (SURGICAL WAND) ×5 IMPLANT
YANKAUER SUCT 12FT TUBE ARGYLE (SUCTIONS) ×7 IMPLANT
YANKAUER SUCT BULB TIP 10FT TU (MISCELLANEOUS) ×2 IMPLANT

## 2015-12-25 NOTE — Anesthesia Procedure Notes (Signed)
Procedure Name: Intubation Date/Time: 12/25/2015 7:45 AM Performed by: Vista Deck Pre-anesthesia Checklist: Patient identified, Patient being monitored, Timeout performed, Emergency Drugs available and Suction available Patient Re-evaluated:Patient Re-evaluated prior to inductionOxygen Delivery Method: Circle System Utilized Preoxygenation: Pre-oxygenation with 100% oxygen Intubation Type: IV induction Ventilation: Mask ventilation without difficulty Laryngoscope Size: Miller and 2 Grade View: Grade I Tube type: Oral Tube size: 7.0 mm Number of attempts: 1 Airway Equipment and Method: Stylet and Oral airway Placement Confirmation: ETT inserted through vocal cords under direct vision,  positive ETCO2 and breath sounds checked- equal and bilateral Secured at: 22 cm Tube secured with: Tape Dental Injury: Teeth and Oropharynx as per pre-operative assessment

## 2015-12-25 NOTE — H&P (View-Only) (Signed)
Chief Complaint  Patient presents with  . Advice Only    Discuss surgery for right rotator cuff tear.    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 Luna Glasgow for evaluation of a right rotator cuff tear involving the supraspinatus tendon as well as the subscapularis.  He has significant range of motion loss severe pain involving the right shoulder associated with weakness.  Review of Systems  Constitutional: Negative for fever and chills.  Respiratory: Negative for shortness of breath and wheezing.   Cardiovascular: Negative for chest pain.  Neurological: Negative for tingling.    Past Medical History  Diagnosis Date  . Testicular cancer ([place])   . Back injury   . Chronic back pain     Past Surgical History  Procedure Laterality Date  . Testicle removal     No family history on file. Social History  Substance Use Topics  . Smoking status: Current Every Day Smoker -- 0.50 packs/day    Types: Cigarettes  . Smokeless tobacco: None  . Alcohol Use: Yes     Comment: beers occasionally    Current outpatient prescriptions:  .  cyclobenzaprine (FLEXERIL) 5 MG tablet, Take 1 tablet (5 mg total) by mouth 3 (three) times daily as needed for muscle spasms., Disp: 30 tablet, Rfl: 0 .  ibuprofen (ADVIL,MOTRIN) 800 MG tablet, Take 1 tablet (800 mg total) by mouth 3 (three) times daily., Disp: 21 tablet, Rfl: 0 .  naproxen (NAPROSYN) 500 MG tablet, Take 1 tablet (500 mg total) by mouth 2 (two) times daily with a meal., Disp: 60 tablet, Rfl: 5 .  oxyCODONE-acetaminophen (PERCOCET/ROXICET) 5-325 MG tablet, One tablet every four hours as needed for pain.  Hydrocodone has been discontinued., Disp: 60 tablet, Rfl: 0 .  predniSONE (DELTASONE) 10 MG tablet, 6, 5, 4, 3, 2 then 1 tablet by mouth daily for 6 days total., Disp: 21 tablet, Rfl: 0  BP 122/79 mmHg  Pulse 85  Ht 5\' 5"  (1.651 m)   Wt 145 lb (65.772 kg)  BMI 24.13 kg/m2  Physical Exam  Constitutional: He is oriented to person, place, and time. He appears well-developed and well-nourished. No distress.  Cardiovascular: Normal rate and intact distal pulses.   Neurological: He is alert and oriented to person, place, and time.  Skin: Skin is warm and dry. No rash noted. He is not diaphoretic. No erythema. No pallor.  Psychiatric: He has a normal mood and affect. His behavior is normal. Judgment and thought content normal.    Ortho Exam  Right shoulder he severely tender over the right shoulder indicating he may have some inflammatory response to this injury going on is also lost external rotation and abduction is limited to 40.  His left shoulder is normal to inspection full range of motion stability and strength normal skin intact pulses intact lymph nodes negative sensation normal  The right shoulder is stable as much as we can tell the skin is normal the pulses are normal lymph nodes are negative in the sensation is normal   ASSESSMENT: My personal interpretation of the images:  The MRI does show rotator cuff tear subscapularis tear  We will probably have to do arthroscopic surgery to evaluate the glenohumeral joint for arthritis and labral degeneration and then to a acromial coracoid incision to get to the subscapularis  I have counseled the patient regarding his difficult postoperative course.  He is already on Percocet for pain and is taking 2 tablets to get relief he is very stiff that will have to be overcome as well. Advised him that he needs to be out of work for 2-3 months      PLAN Arthroscopy right shoulder plus rotator cuff repair right shoulder

## 2015-12-25 NOTE — Anesthesia Preprocedure Evaluation (Signed)
Anesthesia Evaluation  Patient identified by MRN, date of birth, ID band Patient awake    Reviewed: Allergy & Precautions, NPO status , Patient's Chart, lab work & pertinent test results  Airway Mallampati: II  TM Distance: >3 FB     Dental  (+) Teeth Intact, Dental Advisory Given   Pulmonary Current Smoker,    breath sounds clear to auscultation       Cardiovascular  Rhythm:Regular Rate:Normal  Hx atypical CP, resolved.   Neuro/Psych Anxiety    GI/Hepatic negative GI ROS,   Endo/Other    Renal/GU      Musculoskeletal   Abdominal   Peds  Hematology   Anesthesia Other Findings   Reproductive/Obstetrics                             Anesthesia Physical Anesthesia Plan  ASA: II  Anesthesia Plan: General   Post-op Pain Management:    Induction: Intravenous  Airway Management Planned: Oral ETT  Additional Equipment:   Intra-op Plan:   Post-operative Plan: Extubation in OR  Informed Consent: I have reviewed the patients History and Physical, chart, labs and discussed the procedure including the risks, benefits and alternatives for the proposed anesthesia with the patient or authorized representative who has indicated his/her understanding and acceptance.     Plan Discussed with:   Anesthesia Plan Comments:         Anesthesia Quick Evaluation

## 2015-12-25 NOTE — Interval H&P Note (Signed)
History and Physical Interval Note:  12/25/2015 7:23 AM  Eugene Lin  has presented today for surgery, with the diagnosis of RIGHT ROTATOR CUFF TEAR  The various methods of treatment have been discussed with the patient and family. After consideration of risks, benefits and other options for treatment, the patient has consented to  Procedure(s): SHOULDER ARTHROSCOPY WITH ROTATOR CUFF REPAIR (Right) as a surgical intervention .  The patient's history has been reviewed, patient examined, no change in status, stable for surgery.  I have reviewed the patient's chart and labs.  Questions were answered to the patient's satisfaction.     Arther Abbott

## 2015-12-25 NOTE — Brief Op Note (Signed)
12/25/2015  9:50 AM  PATIENT:  Eugene Lin  45 y.o. male  PRE-OPERATIVE DIAGNOSIS:  RIGHT ROTATOR CUFF TEAR  POST-OPERATIVE DIAGNOSIS:  RIGHT ROTATOR CUFF TEAR  PROCEDURE:  Procedure(s): RIGHT SHOULDER ARTHROSCOPY WITH ROTATOR CUFF REPAIR (Right) Right shoulder arthroscopy limited debridement with open rotator cuff repair including acromioplasty  Operative findings supraspinatus tendon tear partial-thickness (95%) Undersurface partial tear subscapularis Bursitis  Once the patient was anesthetized we checked his range of motion with his arm at his side he had 50 external rotation he had 100 of abduction and 150 of forward elevation. Most of his limited motion noted prior to surgery was most likely secondary to pain.  Procedure was done as follows The patient was identified in the preop area is surgical marking was countersigned, chart update and imaging studies reviewed. Patient taken to surgery. Patient given appropriate antibiotics.  He was intubated and placed on a Schlein positioner and placed in the modified beachchair position and then prepped and draped sterilely.  Timeout completed  Diagnostic arthroscopy start from a posterior approach scope placed into the glenohumeral joint. We were able to obtain good visualization  Biceps tendon was intact glenoid labrum intact humeral head intact glenoid intact no arthritis  Undersurface rotator cuff tear was identified primarily in the anterior portion supraspinatus spinal needle was placed in the tear. Subscapularis was found to be partially tear at the superior aspect involving less than 10% of the tendon  Scope was then placed in the subacromial space and the rotator cuff tear was again identified. The tear extended through the majority of the tendon leaving only a few fibers intact at the site of the tear  After debriding the subacromial space and performing a bursectomy the scope was removed from the joint and an open  procedure was began.  The incision was made from the lateral acromial edge to the coracoid subcutaneous tissue was divided deltoid was split in the anterior third and median third and peeled from the acromium.  Cuff tear was easily identified. There was minimal retraction. Identified the biceps tendon and then the subscapularis tear which was debrided superior portion less than 10% of the tendon involved. At this point we did run into some bleeding most likely from the anterior humeral circumflex vessels and these were cauterized.  A suture was placed in inverted mattress for [employer] speech screw bone was prepared by debridement until bleeding bone bed was obtained. The hole for the anchor was punched and tapped and the screw was placed and then the threads from the suture were placed in the anchor per manufacture technique this gave a nice watertight seal the suture in his were passed through the remaining cuff tissue on the opposite side of the tear.   Acromioplasty was then performed with a motorized burr.  There was steady ooze of retro-deltoid blood vessels which had to be controlled with cautery and Surgicel this took some time.  I was able to obtain a dry bed today wound was irrigated and closed with #2 Ethibond suture through drill hole in the acromion with 2 interrupted sutures and then 0 Monocryl suture to close the remaining portion of the deltoid. Simultaneous tissue was closed with 0 Monocryl suture and skin approximated with staples  Portal sites from the arthroscopy closed with 3-0 nylon  60 mL of Marcaine with epinephrine injected around the wound and deltoid. Including subacromial space.  Shoulder immobilizer applied  Patient expired taken recovery in stable condition   SURGEON:  Surgeon(s)  and Role:    * Carole Civil, MD - Primary  PHYSICIAN ASSISTANT:   Assisted by Simonne Maffucci  Anesthesia Gen.  Blood loss estimated 50 mL  No drains were used  60 mL of  Marcaine with epinephrine was used  Clean case no specimen  Counts were correct  Dictation dragon  EBL:  Total I/O In: 900 [I.V.:900] Out: 50 [Blood:50]  PATIENT DISPOSITION:  PACU - hemodynamically stable.   Delay start of Pharmacological VTE agent (>24hrs) due to surgical blood loss or risk of bleeding: not applicable  The patient will go home after PACU

## 2015-12-25 NOTE — Transfer of Care (Signed)
Immediate Anesthesia Transfer of Care Note  Patient: Eugene Lin  Procedure(s) Performed: Procedure(s): RIGHT SHOULDER ARTHROSCOPY WITH OPEN ROTATOR CUFF REPAIR (Right) RIGHT SHOULDER ACROMIOPLASTY (Right)  Patient Location: PACU  Anesthesia Type:General  Level of Consciousness: sedated and patient cooperative  Airway & Oxygen Therapy: Patient Spontanous Breathing and non-rebreather face mask  Post-op Assessment: Report given to RN, Post -op Vital signs reviewed and stable and Patient moving all extremities  Post vital signs: Reviewed and stable   Last Pain:  Filed Vitals:   12/25/15 0726  PainSc: 8          Complications: No apparent anesthesia complications

## 2015-12-25 NOTE — Op Note (Signed)
12/25/2015  9:50 AM  PATIENT:  Eugene Lin  45 y.o. male  PRE-OPERATIVE DIAGNOSIS:  RIGHT ROTATOR CUFF TEAR  POST-OPERATIVE DIAGNOSIS:  RIGHT ROTATOR CUFF TEAR  PROCEDURE:  Procedure(s): RIGHT SHOULDER ARTHROSCOPY WITH ROTATOR CUFF REPAIR (Right) Right shoulder arthroscopy limited debridement with open rotator cuff repair including acromioplasty  Operative findings supraspinatus tendon tear partial-thickness (95%) Undersurface partial tear subscapularis Bursitis  Once the patient was anesthetized we checked his range of motion with his arm at his side he had 50 external rotation he had 100 of abduction and 150 of forward elevation. Most of his limited motion noted prior to surgery was most likely secondary to pain.  Procedure was done as follows The patient was identified in the preop area is surgical marking was countersigned, chart update and imaging studies reviewed. Patient taken to surgery. Patient given appropriate antibiotics.  He was intubated and placed on a Schlein positioner and placed in the modified beachchair position and then prepped and draped sterilely.  Timeout completed  Diagnostic arthroscopy start from a posterior approach scope placed into the glenohumeral joint. We were able to obtain good visualization  Biceps tendon was intact glenoid labrum intact humeral head intact glenoid intact no arthritis  Undersurface rotator cuff tear was identified primarily in the anterior portion supraspinatus spinal needle was placed in the tear. Subscapularis was found to be partially tear at the superior aspect involving less than 10% of the tendon  Scope was then placed in the subacromial space and the rotator cuff tear was again identified. The tear extended through the majority of the tendon leaving only a few fibers intact at the site of the tear  After debriding the subacromial space and performing a bursectomy the scope was removed from the joint and an open  procedure was began.  The incision was made from the lateral acromial edge to the coracoid subcutaneous tissue was divided deltoid was split in the anterior third and median third and peeled from the acromium.  Cuff tear was easily identified. There was minimal retraction. Identified the biceps tendon and then the subscapularis tear which was debrided superior portion less than 10% of the tendon involved. At this point we did run into some bleeding most likely from the anterior humeral circumflex vessels and these were cauterized.  A suture was placed in inverted mattress for Arthro[employer] speech screw bone was prepared by debridement until bleeding bone bed was obtained. The hole for the anchor was punched and tapped and the screw was placed and then the threads from the suture were placed in the anchor per manufacture technique this gave a nice watertight seal the suture in his were passed through the remaining cuff tissue on the opposite side of the tear.   Acromioplasty was then performed with a motorized burr.  There was steady ooze of retro-deltoid blood vessels which had to be controlled with cautery and Surgicel this took some time.  I was able to obtain a dry bed today wound was irrigated and closed with #2 Ethibond suture through drill hole in the acromion with 2 interrupted sutures and then 0 Monocryl suture to close the remaining portion of the deltoid. Simultaneous tissue was closed with 0 Monocryl suture and skin approximated with staples  Portal sites from the arthroscopy closed with 3-0 nylon  60 mL of Marcaine with epinephrine injected around the wound and deltoid. Including subacromial space.  Shoulder immobilizer applied  Patient expired taken recovery in stable condition   SURGEON:  Surgeon(s)  and Role:    * Carole Civil, MD - Primary  PHYSICIAN ASSISTANT:   Assisted by Simonne Maffucci  Anesthesia Gen.  Blood loss estimated 50 mL  No drains were used  60 mL of  Marcaine with epinephrine was used  Clean case no specimen  Counts were correct  Dictation dragon  EBL:  Total I/O In: 900 [I.V.:900] Out: 50 [Blood:50]  PATIENT DISPOSITION:  PACU - hemodynamically stable.   Delay start of Pharmacological VTE agent (>24hrs) due to surgical blood loss or risk of bleeding: not applicable  The patient will go home after PACU

## 2015-12-25 NOTE — Anesthesia Postprocedure Evaluation (Signed)
Anesthesia Post Note  Patient: Eugene Lin  Procedure(s) Performed: Procedure(s) (LRB): RIGHT SHOULDER ARTHROSCOPY WITH OPEN ROTATOR CUFF REPAIR (Right) RIGHT SHOULDER ACROMIOPLASTY (Right)  Patient location during evaluation: PACU Anesthesia Type: General Level of consciousness: awake and alert Pain management: satisfactory to patient Vital Signs Assessment: post-procedure vital signs reviewed and stable Respiratory status: spontaneous breathing Anesthetic complications: no    Last Vitals:  Filed Vitals:   12/25/15 1115 12/25/15 1141  BP: 190/99 133/85  Pulse: 88 85  Temp:  36.8 C  Resp: 15 16    Last Pain:  Filed Vitals:   12/25/15 1142  PainSc: 3                  Elania Crowl

## 2015-12-26 ENCOUNTER — Encounter (HOSPITAL_COMMUNITY): Payer: Self-pay | Admitting: Orthopedic Surgery

## 2015-12-31 ENCOUNTER — Ambulatory Visit (INDEPENDENT_AMBULATORY_CARE_PROVIDER_SITE_OTHER): Payer: BLUE CROSS/BLUE SHIELD | Admitting: Orthopedic Surgery

## 2015-12-31 VITALS — BP 139/77 | HR 84 | Ht 65.0 in | Wt 138.0 lb

## 2015-12-31 DIAGNOSIS — Z4789 Encounter for other orthopedic aftercare: Secondary | ICD-10-CM

## 2015-12-31 DIAGNOSIS — M75101 Unspecified rotator cuff tear or rupture of right shoulder, not specified as traumatic: Secondary | ICD-10-CM

## 2015-12-31 MED ORDER — OXYCODONE-ACETAMINOPHEN 7.5-325 MG PO TABS: 1.0000 | ORAL_TABLET | ORAL | Status: DC | PRN

## 2015-12-31 NOTE — Patient Instructions (Signed)
Start hand and rehabilitation therapy  Wear the sling for 3 more weeks   Reduce Percocet 7.5 mg  Return in one week to get the staples out

## 2015-12-31 NOTE — Progress Notes (Signed)
Chief Complaint  Patient presents with  . Routine Post Op    Right shoulder RCR DOS 12/1715    BP 139/77 mmHg  Pulse 84  Ht 5\' 5"  (1.651 m)  Wt 138 lb (62.596 kg)  BMI 22.96 kg/m2  First postop visit 6 days after surgery arthroscopy right shoulder with rotator cuff repair open technique  Portal sites were removed from the arthroscopy portion  Start hand and rehabilitation therapy  Wear the sling for 3 more weeks   Reduce Percocet 7.5 mg  Return in one week to get the staples out

## 2015-12-31 NOTE — Addendum Note (Signed)
Addended by: Baldomero Lamy B on: 12/31/2015 11:06 AM   Modules accepted: Orders

## 2016-01-07 ENCOUNTER — Encounter: Payer: Self-pay | Admitting: Orthopedic Surgery

## 2016-01-07 ENCOUNTER — Ambulatory Visit (INDEPENDENT_AMBULATORY_CARE_PROVIDER_SITE_OTHER): Payer: BLUE CROSS/BLUE SHIELD | Admitting: Orthopedic Surgery

## 2016-01-07 VITALS — BP 127/69 | HR 72 | Temp 98.1°F | Resp 16 | Ht 65.0 in | Wt 142.2 lb

## 2016-01-07 DIAGNOSIS — Z4789 Encounter for other orthopedic aftercare: Secondary | ICD-10-CM

## 2016-01-07 MED ORDER — OXYCODONE-ACETAMINOPHEN 5-325 MG PO TABS: 1.0000 | ORAL_TABLET | ORAL | Status: DC | PRN

## 2016-01-07 NOTE — Patient Instructions (Signed)
START THERAPY   WEAR SLING REMOVE FOR MEALS  LIMIT USE OF RIGHT ARM TO TOOTHBRUSH, EATING

## 2016-01-07 NOTE — Progress Notes (Signed)
POST OP  Chief Complaint  Patient presents with  . 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 oxyCODONE-acetaminophen (PERCOCET/ROXICET) 5-325 MG tablet    Sig: Take 1 tablet by mouth every 4 (four) hours as needed for severe pain.    Dispense:  84 tablet    Refill:  0

## 2016-01-21 ENCOUNTER — Encounter: Payer: Self-pay | Admitting: Orthopedic Surgery

## 2016-01-21 ENCOUNTER — Ambulatory Visit: Payer: BLUE CROSS/BLUE SHIELD | Admitting: Orthopedic Surgery

## 2016-01-21 VITALS — BP 109/65 | Ht 65.0 in | Wt 143.0 lb

## 2016-01-21 DIAGNOSIS — M75101 Unspecified rotator cuff tear or rupture of right shoulder, not specified as traumatic: Secondary | ICD-10-CM

## 2016-01-21 DIAGNOSIS — Z9889 Other specified postprocedural states: Secondary | ICD-10-CM

## 2016-01-21 DIAGNOSIS — Z4789 Encounter for other orthopedic aftercare: Secondary | ICD-10-CM

## 2016-01-21 MED ORDER — OXYCODONE-ACETAMINOPHEN 5-325 MG PO TABS: 1.0000 | ORAL_TABLET | ORAL | Status: DC | PRN

## 2016-01-21 NOTE — Progress Notes (Signed)
Chief Complaint  Patient presents with  . Follow-up    POST OP RT RCR, DOS 12/25/15    BP 109/65 mmHg  Ht 5\' 5"  (1.651 m)  Wt 143 lb (64.864 kg)  BMI 23.80 kg/m2  4 weeks postop right rotator cuff repair patient says he is doing fairly well  Complains of stiffness. On exam he has a lot of peri-acromial spasm trapezius muscle guarding and scapular stiffness  His incision looks good.  He will continue with therapy come back in 4 weeks.  He's on Robaxin oxycodone ibuprofen and gabapentin

## 2016-02-24 ENCOUNTER — Ambulatory Visit (INDEPENDENT_AMBULATORY_CARE_PROVIDER_SITE_OTHER): Payer: BLUE CROSS/BLUE SHIELD | Admitting: Orthopedic Surgery

## 2016-02-24 ENCOUNTER — Encounter: Payer: Self-pay | Admitting: Orthopedic Surgery

## 2016-02-24 VITALS — BP 126/72 | Ht 64.25 in | Wt 142.0 lb

## 2016-02-24 DIAGNOSIS — Z4789 Encounter for other orthopedic aftercare: Secondary | ICD-10-CM

## 2016-02-24 MED ORDER — OXYCODONE-ACETAMINOPHEN 5-325 MG PO TABS: 1.0000 | ORAL_TABLET | ORAL | Status: AC | PRN

## 2016-02-24 NOTE — Progress Notes (Signed)
Postop visit status post rotator cuff repair right shoulder arthroscopy with mini open technique done on May 17 these 2 months postop he cannot go to therapy has to go to work. He can afford short-term disability and can't afford physical therapy he is on a self-directed program  Exam shows he is very stiff has a significant amount of scapular substitution external rotation is only 30 his abduction is 60 and is forward elevation passively is 90  I talked to him about therapy he says he can afford it.  He is on Percocet for pain and lasting him about a month although the rate and 1 every 4 when necessary pain #84  He got a refill today seems to be controlling his pain well and is able to work without much of a problem other than the fact that his workload has increased  He is cautioned about heavy lifting and I'll see him again in a month

## 2016-03-26 ENCOUNTER — Ambulatory Visit: Payer: Self-pay | Admitting: Orthopedic Surgery
